# Patient Record
Sex: Female | Born: 1960
Health system: Southern US, Community
[De-identification: ages and names within clinical notes are randomized; demographics above are authoritative.]

## PROBLEM LIST (undated history)

## (undated) DIAGNOSIS — D259 Leiomyoma of uterus, unspecified: Secondary | ICD-10-CM

## (undated) DIAGNOSIS — Z86711 Personal history of pulmonary embolism: Secondary | ICD-10-CM

## (undated) DIAGNOSIS — K219 Gastro-esophageal reflux disease without esophagitis: Secondary | ICD-10-CM

## (undated) DIAGNOSIS — Z87442 Personal history of urinary calculi: Secondary | ICD-10-CM

## (undated) DIAGNOSIS — K449 Diaphragmatic hernia without obstruction or gangrene: Secondary | ICD-10-CM

## (undated) DIAGNOSIS — R319 Hematuria, unspecified: Secondary | ICD-10-CM

## (undated) DIAGNOSIS — K5900 Constipation, unspecified: Secondary | ICD-10-CM

## (undated) DIAGNOSIS — R350 Frequency of micturition: Secondary | ICD-10-CM

## (undated) DIAGNOSIS — N201 Calculus of ureter: Secondary | ICD-10-CM

## (undated) DIAGNOSIS — M7660 Achilles tendinitis, unspecified leg: Secondary | ICD-10-CM

## (undated) DIAGNOSIS — Z8759 Personal history of other complications of pregnancy, childbirth and the puerperium: Secondary | ICD-10-CM

## (undated) DIAGNOSIS — Z973 Presence of spectacles and contact lenses: Secondary | ICD-10-CM

## (undated) DIAGNOSIS — Z9109 Other allergy status, other than to drugs and biological substances: Secondary | ICD-10-CM

## (undated) DIAGNOSIS — R3915 Urgency of urination: Secondary | ICD-10-CM

## (undated) DIAGNOSIS — M1990 Unspecified osteoarthritis, unspecified site: Secondary | ICD-10-CM

## (undated) HISTORY — PX: STRABISMUS SURGERY: SHX218

## (undated) HISTORY — PX: WISDOM TOOTH EXTRACTION: SHX21

## (undated) HISTORY — PX: TONSILLECTOMY: SUR1361

## (undated) HISTORY — DX: Other allergy status, other than to drugs and biological substances: Z91.09

---

## 1993-07-24 HISTORY — PX: LAPAROSCOPIC CHOLECYSTECTOMY: SUR755

## 2001-10-16 ENCOUNTER — Other Ambulatory Visit: Admission: RE | Admit: 2001-10-16 | Discharge: 2001-10-16 | Payer: Self-pay | Admitting: Obstetrics and Gynecology

## 2002-02-27 ENCOUNTER — Encounter: Payer: Self-pay | Admitting: Obstetrics and Gynecology

## 2002-02-27 ENCOUNTER — Ambulatory Visit (HOSPITAL_COMMUNITY): Admission: RE | Admit: 2002-02-27 | Discharge: 2002-02-27 | Payer: Self-pay | Admitting: Obstetrics and Gynecology

## 2002-10-21 ENCOUNTER — Other Ambulatory Visit: Admission: RE | Admit: 2002-10-21 | Discharge: 2002-10-21 | Payer: Self-pay | Admitting: Obstetrics and Gynecology

## 2003-03-02 ENCOUNTER — Ambulatory Visit (HOSPITAL_COMMUNITY): Admission: RE | Admit: 2003-03-02 | Discharge: 2003-03-02 | Payer: Self-pay | Admitting: Pediatrics

## 2003-03-02 ENCOUNTER — Encounter: Payer: Self-pay | Admitting: Obstetrics and Gynecology

## 2003-11-10 ENCOUNTER — Other Ambulatory Visit: Admission: RE | Admit: 2003-11-10 | Discharge: 2003-11-10 | Payer: Self-pay | Admitting: Obstetrics and Gynecology

## 2004-03-03 ENCOUNTER — Ambulatory Visit (HOSPITAL_COMMUNITY): Admission: RE | Admit: 2004-03-03 | Discharge: 2004-03-03 | Payer: Self-pay | Admitting: Obstetrics and Gynecology

## 2004-11-11 ENCOUNTER — Other Ambulatory Visit: Admission: RE | Admit: 2004-11-11 | Discharge: 2004-11-11 | Payer: Self-pay | Admitting: Obstetrics and Gynecology

## 2005-03-06 ENCOUNTER — Ambulatory Visit (HOSPITAL_COMMUNITY): Admission: RE | Admit: 2005-03-06 | Discharge: 2005-03-06 | Payer: Self-pay | Admitting: Obstetrics and Gynecology

## 2005-11-15 ENCOUNTER — Other Ambulatory Visit: Admission: RE | Admit: 2005-11-15 | Discharge: 2005-11-15 | Payer: Self-pay | Admitting: Obstetrics and Gynecology

## 2006-03-08 ENCOUNTER — Ambulatory Visit (HOSPITAL_COMMUNITY): Admission: RE | Admit: 2006-03-08 | Discharge: 2006-03-08 | Payer: Self-pay | Admitting: Obstetrics & Gynecology

## 2006-03-15 ENCOUNTER — Encounter: Admission: RE | Admit: 2006-03-15 | Discharge: 2006-03-15 | Payer: Self-pay | Admitting: Obstetrics & Gynecology

## 2006-12-07 ENCOUNTER — Other Ambulatory Visit: Admission: RE | Admit: 2006-12-07 | Discharge: 2006-12-07 | Payer: Self-pay | Admitting: Obstetrics & Gynecology

## 2007-03-18 ENCOUNTER — Ambulatory Visit (HOSPITAL_COMMUNITY): Admission: RE | Admit: 2007-03-18 | Discharge: 2007-03-18 | Payer: Self-pay | Admitting: Obstetrics & Gynecology

## 2007-07-01 ENCOUNTER — Emergency Department (HOSPITAL_COMMUNITY): Admission: EM | Admit: 2007-07-01 | Discharge: 2007-07-01 | Payer: Self-pay | Admitting: Emergency Medicine

## 2007-12-13 ENCOUNTER — Other Ambulatory Visit: Admission: RE | Admit: 2007-12-13 | Discharge: 2007-12-13 | Payer: Self-pay | Admitting: Obstetrics & Gynecology

## 2008-03-19 ENCOUNTER — Ambulatory Visit (HOSPITAL_COMMUNITY): Admission: RE | Admit: 2008-03-19 | Discharge: 2008-03-19 | Payer: Self-pay | Admitting: Obstetrics & Gynecology

## 2009-03-22 ENCOUNTER — Ambulatory Visit (HOSPITAL_COMMUNITY): Admission: RE | Admit: 2009-03-22 | Discharge: 2009-03-22 | Payer: Self-pay | Admitting: Obstetrics & Gynecology

## 2010-03-23 ENCOUNTER — Ambulatory Visit (HOSPITAL_COMMUNITY): Admission: RE | Admit: 2010-03-23 | Discharge: 2010-03-23 | Payer: Self-pay | Admitting: Obstetrics & Gynecology

## 2010-08-07 ENCOUNTER — Emergency Department (HOSPITAL_BASED_OUTPATIENT_CLINIC_OR_DEPARTMENT_OTHER)
Admission: EM | Admit: 2010-08-07 | Discharge: 2010-08-07 | Payer: Self-pay | Source: Home / Self Care | Admitting: Emergency Medicine

## 2010-08-08 LAB — URINE MICROSCOPIC-ADD ON

## 2010-08-08 LAB — URINALYSIS, ROUTINE W REFLEX MICROSCOPIC
Bilirubin Urine: NEGATIVE
Ketones, ur: NEGATIVE mg/dL
Leukocytes, UA: NEGATIVE
Nitrite: NEGATIVE
Protein, ur: NEGATIVE mg/dL
Specific Gravity, Urine: 1.005 (ref 1.005–1.030)
Urine Glucose, Fasting: NEGATIVE mg/dL
Urobilinogen, UA: 0.2 mg/dL (ref 0.0–1.0)
pH: 6 (ref 5.0–8.0)

## 2010-08-08 LAB — WET PREP, GENITAL
Trich, Wet Prep: NONE SEEN
Yeast Wet Prep HPF POC: NONE SEEN

## 2010-08-10 LAB — GC/CHLAMYDIA PROBE AMP, GENITAL
Chlamydia, DNA Probe: NEGATIVE
GC Probe Amp, Genital: NEGATIVE

## 2010-08-10 LAB — URINE CULTURE
Colony Count: NO GROWTH
Culture  Setup Time: 201201152204
Culture: NO GROWTH

## 2010-08-14 ENCOUNTER — Encounter: Payer: Self-pay | Admitting: Obstetrics & Gynecology

## 2010-08-19 ENCOUNTER — Encounter
Admission: RE | Admit: 2010-08-19 | Discharge: 2010-08-19 | Payer: Self-pay | Source: Home / Self Care | Attending: Family Medicine | Admitting: Family Medicine

## 2010-12-06 NOTE — Consult Note (Signed)
Sharon Page, Sharon Page             ACCOUNT NO.:  [id number]   MEDICAL RECORD NO.:  [id number]          PATIENT TYPE:  EMS   LOCATION:  [facility]                         FACILITY:  [facility]   PHYSICIAN:  Wilson Singer, M.D.DATE OF BIRTH:  [birth year]   DATE OF CONSULTATION:  07/01/2007  DATE OF DISCHARGE:                                 CONSULTATION   REFERRING PHYSICIAN:  Dr. Valma Cava.   HISTORY:  This is a 50 year old somewhat overweight lady who presents  with sharp lower chest pain which she has had about 3 episodes today,  each lasting only 10 minutes and associated with possible left shoulder  pain.  She says that a couple of days ago she was lifting heavy boxes  doing Christmas decorations.  The pain is described as sharp in nature  and is not associated with nausea, sweating, or shortness of breath.  She has no family history of early coronary artery disease.  She is not  diabetic, hypertensive, nor does she have any history of hyperlipidemia.  She is a nonsmoker.   PAST SURGICAL HISTORY:  1. Two cesarean sections.  2. Cholecystectomy.  3. Tonsillectomy.  4. Eye surgery.   SOCIAL HISTORY:  She has been married for 22 years.  She does not smoke  and does not drink alcohol.  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   MEDICATIONS:  Birth control pill.   ALLERGIES:  MORPHINE.   REVIEW OF SYSTEMS:  Apart from the symptoms mentioned above, there are  no other symptoms referable to all systems reviewed.   PHYSICAL EXAMINATION:  VITAL SIGNS:  Temperature 98.6, blood pressure  124/79, pulse 71, saturation 100%, respiratory rate 12.  CARDIOVASCULAR:  Heart sounds are present and normal.  There are no  murmurs.  There is no pericardial rub.  RESPIRATORY:  Lung fields are clear.  There is no pleural rub.  There  are no wheezes or crackles.  ABDOMEN:  Soft, nontender with no hepatosplenomegaly.  NEUROLOGICAL:  Alert and oriented with no focal neurologic signs.  ANTERIOR CHEST WALL:  She clearly is tender in the lower sternum,  reproducing her pain.  She also is tender in the left upper arm,  reproducing the pain that she described as being in the left shoulder  area.   INVESTIGATIONS:  Electrocardiogram done in the emergency room shows  normal sinus rhythm and is essentially within normal limits with no  acute ST-T wave changes.  Sodium 138, potassium 3.9, chloride 106, BUN  8, glucose 90, creatinine 1.0.  Hemoglobin 14.1, white blood cell count  6.0, platelets 214,000.  Troponin less than 0.05.  D-dimer 0.32.  Chest  x-ray done looks within normal limits to my eye.   IMPRESSION:  Musculoskeletal chest pain.   I do not think this is cardiac pain, and she has no major risk factors  for cardiac disease.   PLAN:  I am going to send her home, and I have given her a prescription  for ibuprofen 800 mg t.i.d. with food for the next 5 days.  She must  follow up with her primary  care physician in the next day or two to see  if he recommends an outpatient stress test under the care of a  cardiologist, but I would think that she clearly has low risk for  cardiac disease.      Wilson Singer, M.D.  Electronically Signed     NCG/MEDQ  D:  07/01/2007  T:  07/02/2007  Job:  [id number]   cc:   Theron Arista A. Patrica Duel, M.D.  [facility]

## 2011-01-16 ENCOUNTER — Other Ambulatory Visit: Payer: Self-pay | Admitting: Family Medicine

## 2011-01-16 DIAGNOSIS — R52 Pain, unspecified: Secondary | ICD-10-CM

## 2011-01-16 DIAGNOSIS — R609 Edema, unspecified: Secondary | ICD-10-CM

## 2011-01-17 ENCOUNTER — Ambulatory Visit
Admission: RE | Admit: 2011-01-17 | Discharge: 2011-01-17 | Disposition: A | Discharge status: Home / Self Care | Payer: BC Managed Care – PPO | Source: Ambulatory Visit | Attending: Family Medicine | Admitting: Family Medicine

## 2011-01-17 DIAGNOSIS — R609 Edema, unspecified: Secondary | ICD-10-CM

## 2011-01-17 DIAGNOSIS — R52 Pain, unspecified: Secondary | ICD-10-CM

## 2011-01-21 DIAGNOSIS — Z86711 Personal history of pulmonary embolism: Secondary | ICD-10-CM | POA: Insufficient documentation

## 2011-01-21 HISTORY — DX: Personal history of pulmonary embolism: Z86.711

## 2011-01-22 ENCOUNTER — Emergency Department (HOSPITAL_BASED_OUTPATIENT_CLINIC_OR_DEPARTMENT_OTHER)
Admission: EM | Admit: 2011-01-22 | Discharge: 2011-01-22 | Disposition: A | Discharge status: Other Acute Inpatient Hospital | Payer: BC Managed Care – PPO | Attending: Emergency Medicine | Admitting: Emergency Medicine

## 2011-01-22 ENCOUNTER — Emergency Department (INDEPENDENT_AMBULATORY_CARE_PROVIDER_SITE_OTHER): Payer: BC Managed Care – PPO

## 2011-01-22 DIAGNOSIS — R0602 Shortness of breath: Secondary | ICD-10-CM

## 2011-01-22 DIAGNOSIS — I2699 Other pulmonary embolism without acute cor pulmonale: Secondary | ICD-10-CM

## 2011-01-22 DIAGNOSIS — R079 Chest pain, unspecified: Secondary | ICD-10-CM

## 2011-01-22 LAB — DIFFERENTIAL
Basophils Absolute: 0 10*3/uL (ref 0.0–0.1)
Basophils Relative: 0 % (ref 0–1)
Eosinophils Absolute: 0.3 10*3/uL (ref 0.0–0.7)
Eosinophils Relative: 3 % (ref 0–5)
Lymphocytes Relative: 23 % (ref 12–46)
Lymphs Abs: 2 10*3/uL (ref 0.7–4.0)
Monocytes Absolute: 0.6 10*3/uL (ref 0.1–1.0)
Monocytes Relative: 7 % (ref 3–12)
Neutro Abs: 6 10*3/uL (ref 1.7–7.7)
Neutrophils Relative %: 67 % (ref 43–77)

## 2011-01-22 LAB — CBC
HCT: 39.4 % (ref 36.0–46.0)
Hemoglobin: 13.5 g/dL (ref 12.0–15.0)
MCH: 29.3 pg (ref 26.0–34.0)
MCHC: 34.3 g/dL (ref 30.0–36.0)
MCV: 85.5 fL (ref 78.0–100.0)
Platelets: 174 10*3/uL (ref 150–400)
RBC: 4.61 MIL/uL (ref 3.87–5.11)
RDW: 13 % (ref 11.5–15.5)
WBC: 8.9 10*3/uL (ref 4.0–10.5)

## 2011-01-22 LAB — COMPREHENSIVE METABOLIC PANEL
ALT: 11 U/L (ref 0–35)
Albumin: 3.6 g/dL (ref 3.5–5.2)
Calcium: 9.1 mg/dL (ref 8.4–10.5)
GFR calc Af Amer: >60 mL/min (ref >60)
Glucose, Bld: 109 mg/dL — ABNORMAL HIGH (ref 70–99)
Sodium: 140 mEq/L (ref 135–145)
Total Protein: 7.1 g/dL (ref 6.0–8.3)

## 2011-01-22 LAB — D-DIMER, QUANTITATIVE: D-Dimer, Quant: 1.82 ug/mL-FEU — ABNORMAL HIGH (ref 0.00–0.48)

## 2011-01-22 LAB — CK TOTAL AND CKMB (NOT AT ARMC)
CK, MB: 1 ng/mL (ref 0.3–4.0)
Relative Index: INVALID (ref 0.0–2.5)
Total CK: 57 U/L (ref 7–177)

## 2011-01-22 LAB — TROPONIN I: Troponin I: <0.3 ng/mL (ref <0.30)

## 2011-01-22 LAB — APTT: aPTT: 25 seconds (ref 24–37)

## 2011-01-22 LAB — PROTIME-INR: INR: 0.88 (ref 0.00–1.49)

## 2011-01-22 MED ORDER — IOHEXOL 350 MG/ML SOLN
80.0000 mL | Freq: Once | INTRAVENOUS | Status: AC | PRN
  Administered 2011-01-22: 80 mL via INTRAVENOUS

## 2011-02-27 ENCOUNTER — Other Ambulatory Visit: Payer: Self-pay | Admitting: Obstetrics & Gynecology

## 2011-02-27 DIAGNOSIS — Z1231 Encounter for screening mammogram for malignant neoplasm of breast: Secondary | ICD-10-CM

## 2011-03-28 ENCOUNTER — Ambulatory Visit (HOSPITAL_COMMUNITY)
Admission: RE | Admit: 2011-03-28 | Discharge: 2011-03-28 | Disposition: A | Discharge status: Home / Self Care | Payer: BC Managed Care – PPO | Source: Ambulatory Visit | Attending: Obstetrics & Gynecology | Admitting: Obstetrics & Gynecology

## 2011-03-28 DIAGNOSIS — Z1231 Encounter for screening mammogram for malignant neoplasm of breast: Secondary | ICD-10-CM | POA: Insufficient documentation

## 2011-05-01 LAB — I-STAT 8, (EC8 V) (CONVERTED LAB)
Acid-base deficit: 1
Bicarbonate: 24.4 — ABNORMAL HIGH
HCT: 45
Hemoglobin: 15.3 — ABNORMAL HIGH
Operator id: 294501
Sodium: 138
TCO2: 26

## 2011-05-01 LAB — DIFFERENTIAL
Basophils Absolute: 0
Lymphocytes Relative: 33
Neutro Abs: 3.6

## 2011-05-01 LAB — POCT CARDIAC MARKERS
Myoglobin, poc: 102
Operator id: 294501
Troponin i, poc: <0.05

## 2011-05-01 LAB — CBC
Hemoglobin: 14.1
Platelets: 214
RDW: 12.8
WBC: 6

## 2011-05-01 LAB — POCT I-STAT CREATININE: Operator id: 294501

## 2011-06-28 ENCOUNTER — Emergency Department (HOSPITAL_BASED_OUTPATIENT_CLINIC_OR_DEPARTMENT_OTHER)
Admission: EM | Admit: 2011-06-28 | Discharge: 2011-06-28 | Disposition: A | Discharge status: Home / Self Care | Payer: BC Managed Care – PPO | Attending: Emergency Medicine | Admitting: Emergency Medicine

## 2011-06-28 ENCOUNTER — Encounter: Payer: Self-pay | Admitting: Family Medicine

## 2011-06-28 ENCOUNTER — Other Ambulatory Visit: Payer: Self-pay

## 2011-06-28 DIAGNOSIS — Z7901 Long term (current) use of anticoagulants: Secondary | ICD-10-CM | POA: Insufficient documentation

## 2011-06-28 DIAGNOSIS — R42 Dizziness and giddiness: Secondary | ICD-10-CM | POA: Insufficient documentation

## 2011-06-28 DIAGNOSIS — R5381 Other malaise: Secondary | ICD-10-CM | POA: Insufficient documentation

## 2011-06-28 DIAGNOSIS — R5383 Other fatigue: Secondary | ICD-10-CM | POA: Insufficient documentation

## 2011-06-28 DIAGNOSIS — R531 Weakness: Secondary | ICD-10-CM

## 2011-06-28 DIAGNOSIS — Z79899 Other long term (current) drug therapy: Secondary | ICD-10-CM | POA: Insufficient documentation

## 2011-06-28 DIAGNOSIS — N390 Urinary tract infection, site not specified: Secondary | ICD-10-CM

## 2011-06-28 LAB — URINALYSIS, ROUTINE W REFLEX MICROSCOPIC
Bilirubin Urine: NEGATIVE
Glucose, UA: NEGATIVE mg/dL
Hgb urine dipstick: NEGATIVE
Specific Gravity, Urine: 1.025 (ref 1.005–1.030)
Urobilinogen, UA: 0.2 mg/dL (ref 0.0–1.0)
pH: 6 (ref 5.0–8.0)

## 2011-06-28 LAB — CBC
MCV: 86.1 fL (ref 78.0–100.0)
Platelets: 206 10*3/uL (ref 150–400)
RDW: 12.8 % (ref 11.5–15.5)
WBC: 6.1 10*3/uL (ref 4.0–10.5)

## 2011-06-28 LAB — COMPREHENSIVE METABOLIC PANEL
ALT: 18 U/L (ref 0–35)
AST: 16 U/L (ref 0–37)
Albumin: 4.2 g/dL (ref 3.5–5.2)
CO2: 26 mEq/L (ref 19–32)
Calcium: 9.3 mg/dL (ref 8.4–10.5)
GFR calc non Af Amer: 85 mL/min — ABNORMAL LOW (ref >90)
Sodium: 140 mEq/L (ref 135–145)

## 2011-06-28 LAB — DIFFERENTIAL
Basophils Absolute: 0 10*3/uL (ref 0.0–0.1)
Eosinophils Relative: 3 % (ref 0–5)
Lymphocytes Relative: 50 % — ABNORMAL HIGH (ref 12–46)
Neutro Abs: 2.5 10*3/uL (ref 1.7–7.7)

## 2011-06-28 LAB — PROTIME-INR
INR: 1.31 (ref 0.00–1.49)
Prothrombin Time: 16.5 seconds — ABNORMAL HIGH (ref 11.6–15.2)

## 2011-06-28 LAB — URINE MICROSCOPIC-ADD ON

## 2011-06-28 MED ORDER — SULFAMETHOXAZOLE-TRIMETHOPRIM 800-160 MG PO TABS: 1.0000 | ORAL_TABLET | Freq: Two times a day (BID) | ORAL | Status: AC

## 2011-06-28 NOTE — ED Notes (Addendum)
Pt c/o dizziness x 2 days. Pt sts she had a nosebleed on Sunday. Pt is on coumadin and level recently checked. Pt denies cp, shob. Pt reports long flight last week.

## 2011-06-28 NOTE — ED Notes (Signed)
MD in to re-evaluate pt now.

## 2011-06-28 NOTE — ED Provider Notes (Addendum)
History     CSN: [id number] Arrival date & time: 06/28/2011  7:47 PM   First MD Initiated Contact with Patient 06/28/11 2025      Chief Complaint  Patient presents with  . Dizziness   very pleasant female with a known history of pulmonary embolism. It was diagnosed this past summer. She is on Coumadin. SHe states she began having a head cold approximately a week ago and was seen by her primary doctor, who put her on a Z-Pak. She was also told that she had some fluid behind her ears. Patient then had epistaxis later that week. This last Sunday he went to an urgent care to have her INR checked which was reported as being slightly subtherapeutic. She then felt better. The next day however, over the past 2 days. She has felt somewhat run down, somewhat weak. However, she has had no chest pain or any difficulty breathing. She denies any dysuria or hematuria. She has had no headache no nausea, no visual changes. Essentially, does feel somewhat out of sorts. She does admit to being under a lot of stress recently.  (Consider location/radiation/quality/duration/timing/severity/associated sxs/prior treatment) HPI  Past Medical History  Diagnosis Date  . Pulmonary embolism     Past Surgical History  Procedure Date  . Cholecystectomy   . Tonsillectomy   . Cesarean section     No family history on file.  History  Substance Use Topics  . Smoking status: Never Smoker   . Smokeless tobacco: Not on file  . Alcohol Use: Yes    OB History    Grav Para Term Preterm Abortions TAB SAB Ect Mult Living                  Review of Systems  All other systems reviewed and are negative.    Allergies  Ciprofloxacin hcl; Dilaudid; and Morphine and related  Home Medications   Current Outpatient Rx  Name Route Sig Dispense Refill  . ONE-DAILY MULTI VITAMINS PO TABS Oral Take 1 tablet by mouth daily.      Marland Kitchen VITAMIN C 500 MG PO TABS Oral Take 500 mg by mouth daily.      Marland Kitchen VITAMIN D  (ERGOCALCIFEROL) 50000 UNITS PO CAPS Oral Take 50,000 Units by mouth every 14 (fourteen) days. Take on Sunday     . WARFARIN SODIUM 2.5 MG PO TABS Oral Take 2.5-5 mg by mouth daily. Take 1 tab on Monday, Wednesday, Friday and Saturday. Take 2 tabs on Tuesday, Thursday and Sunday      BP 152/103  Pulse 58  Temp(Src) 98 F (36.7 C) (Oral)  Resp 16  Ht 5\' 1"  (1.549 m)  Wt 206 lb (93.441 kg)  BMI 38.92 kg/m2  SpO2 100%  LMP 12/28/2010  Physical Exam  Nursing note and vitals reviewed. Constitutional: She is oriented to person, place, and time. She appears well-developed and well-nourished.  HENT:  Head: Normocephalic and atraumatic.  Eyes: Conjunctivae and EOM are normal. Pupils are equal, round, and reactive to light.  Neck: Neck supple.  Cardiovascular: Normal rate and regular rhythm.  Exam reveals no gallop and no friction rub.   No murmur heard. Pulmonary/Chest: Breath sounds normal. She has no wheezes. She has no rales. She exhibits no tenderness.  Abdominal: Soft. Bowel sounds are normal. She exhibits no distension. There is no tenderness. There is no rebound and no guarding.  Musculoskeletal: Normal range of motion. She exhibits no edema and no tenderness.  Neurological: She is alert  and oriented to person, place, and time. No cranial nerve deficit. Coordination normal.  Skin: Skin is warm and dry. No rash noted.  Psychiatric: She has a normal mood and affect.    ED Course  Procedures (including critical care time)  Labs Reviewed  DIFFERENTIAL - Abnormal; Notable for the following:    Neutrophils Relative 42 (*)    Lymphocytes Relative 50 (*)    All other components within normal limits  COMPREHENSIVE METABOLIC PANEL - Abnormal; Notable for the following:    Total Bilirubin 0.2 (*)    GFR calc non Af Amer 85 (*)    All other components within normal limits  URINALYSIS, ROUTINE W REFLEX MICROSCOPIC - Abnormal; Notable for the following:    Leukocytes, UA MODERATE (*)     All other components within normal limits  URINE MICROSCOPIC-ADD ON - Abnormal; Notable for the following:    Crystals CA OXALATE CRYSTALS (*)    All other components within normal limits  PROTIME-INR - Abnormal; Notable for the following:    Prothrombin Time 16.5 (*)    All other components within normal limits  CBC   No results found.   No diagnosis found.    MDM  Pt is seen and examined;  Initial history and physical completed.  Will follow.    Date: 06/28/2011  Rate: 63  Rhythm: normal sinus rhythm  QRS Axis: normal  Intervals: normal  ST/T Wave abnormalities: normal  Conduction Disutrbances:none  Narrative Interpretation:   Old EKG Reviewed: none available            Michelle Vanhise A. Patrica Duel, MD 06/28/11 2040  Results for orders placed during the hospital encounter of 06/28/11  CBC      Component Value Range   WBC 6.1  4.0 - 10.5 (K/uL)   RBC 4.98  3.87 - 5.11 (MIL/uL)   Hemoglobin 14.6  12.0 - 15.0 (g/dL)   HCT 16.1  09.6 - 04.5 (%)   MCV 86.1  78.0 - 100.0 (fL)   MCH 29.3  26.0 - 34.0 (pg)   MCHC 34.0  30.0 - 36.0 (g/dL)   RDW 40.9  81.1 - 91.4 (%)   Platelets 206  150 - 400 (K/uL)  DIFFERENTIAL      Component Value Range   Neutrophils Relative 42 (*) 43 - 77 (%)   Neutro Abs 2.5  1.7 - 7.7 (K/uL)   Lymphocytes Relative 50 (*) 12 - 46 (%)   Lymphs Abs 3.0  0.7 - 4.0 (K/uL)   Monocytes Relative 5  3 - 12 (%)   Monocytes Absolute 0.3  0.1 - 1.0 (K/uL)   Eosinophils Relative 3  0 - 5 (%)   Eosinophils Absolute 0.2  0.0 - 0.7 (K/uL)   Basophils Relative 1  0 - 1 (%)   Basophils Absolute 0.0  0.0 - 0.1 (K/uL)  COMPREHENSIVE METABOLIC PANEL      Component Value Range   Sodium 140  135 - 145 (mEq/L)   Potassium 3.5  3.5 - 5.1 (mEq/L)   Chloride 105  96 - 112 (mEq/L)   CO2 26  19 - 32 (mEq/L)   Glucose, Bld 88  70 - 99 (mg/dL)   BUN 16  6 - 23 (mg/dL)   Creatinine, Ser 7.82  0.50 - 1.10 (mg/dL)   Calcium 9.3  8.4 - 95.6 (mg/dL)   Total Protein 7.3   6.0 - 8.3 (g/dL)   Albumin 4.2  3.5 - 5.2 (g/dL)   AST  16  0 - 37 (U/L)   ALT 18  0 - 35 (U/L)   Alkaline Phosphatase 86  39 - 117 (U/L)   Total Bilirubin 0.2 (*) 0.3 - 1.2 (mg/dL)   GFR calc non Af Amer 85 (*) >90 (mL/min)   GFR calc Af Amer >90  >90 (mL/min)  URINALYSIS, ROUTINE W REFLEX MICROSCOPIC      Component Value Range   Color, Urine YELLOW  YELLOW    APPearance CLEAR  CLEAR    Specific Gravity, Urine 1.025  1.005 - 1.030    pH 6.0  5.0 - 8.0    Glucose, UA NEGATIVE  NEGATIVE (mg/dL)   Hgb urine dipstick NEGATIVE  NEGATIVE    Bilirubin Urine NEGATIVE  NEGATIVE    Ketones, ur NEGATIVE  NEGATIVE (mg/dL)   Protein, ur NEGATIVE  NEGATIVE (mg/dL)   Urobilinogen, UA 0.2  0.0 - 1.0 (mg/dL)   Nitrite NEGATIVE  NEGATIVE    Leukocytes, UA MODERATE (*) NEGATIVE   URINE MICROSCOPIC-ADD ON      Component Value Range   Squamous Epithelial / LPF RARE  RARE    WBC, UA 7-10  <3 (WBC/hpf)   Bacteria, UA RARE  RARE    Crystals CA OXALATE CRYSTALS (*) NEGATIVE   PROTIME-INR      Component Value Range   Prothrombin Time 16.5 (*) 11.6 - 15.2 (seconds)   INR 1.31  0.00 - 1.49    No results found.    Florie Carico A. Patrica Duel, MD 06/28/11 2112

## 2011-09-21 ENCOUNTER — Encounter (HOSPITAL_BASED_OUTPATIENT_CLINIC_OR_DEPARTMENT_OTHER): Payer: Self-pay

## 2011-09-21 ENCOUNTER — Emergency Department (HOSPITAL_BASED_OUTPATIENT_CLINIC_OR_DEPARTMENT_OTHER)
Admission: EM | Admit: 2011-09-21 | Discharge: 2011-09-21 | Disposition: A | Discharge status: Home / Self Care | Payer: BC Managed Care – PPO | Attending: Emergency Medicine | Admitting: Emergency Medicine

## 2011-09-21 DIAGNOSIS — Z86718 Personal history of other venous thrombosis and embolism: Secondary | ICD-10-CM | POA: Insufficient documentation

## 2011-09-21 DIAGNOSIS — Z7901 Long term (current) use of anticoagulants: Secondary | ICD-10-CM | POA: Insufficient documentation

## 2011-09-21 DIAGNOSIS — M79609 Pain in unspecified limb: Secondary | ICD-10-CM | POA: Insufficient documentation

## 2011-09-21 DIAGNOSIS — M79606 Pain in leg, unspecified: Secondary | ICD-10-CM

## 2011-09-21 DIAGNOSIS — Z79899 Other long term (current) drug therapy: Secondary | ICD-10-CM | POA: Insufficient documentation

## 2011-09-21 NOTE — ED Notes (Signed)
Pt c/o L thigh pain onset yesterday.  Pt describes as constant dull pain and intermittent sharp pain.

## 2011-09-21 NOTE — ED Provider Notes (Signed)
History     CSN: [id number]  Arrival date & time 09/21/11  1716   First MD Initiated Contact with Patient 09/21/11 1731      Chief Complaint  Patient presents with  . Leg Pain    (Consider location/radiation/quality/duration/timing/severity/associated sxs/prior treatment) HPI Comments: Patient presents complaining of left medial thigh pain since yesterday.  It is intermittent in nature.  It was somewhat dull in nature initially and does have some intermittent sharp sensation.  Patient does not want any further pain medication for it is time.  She did contact her primary care physician who instructed her to come to ER because of concern for possible DVT.  Patient did have a history of PEs after long car trips in [place] and back while she was on birth control.  Patient is currently on Coumadin and notes she had an INR 3.1 on Tuesday.  She is not on birth control anymore.  She has no chest pain or shortness of breath at this time.  She does not believe that her leg is swollen at this time.  Patient is a 51 y.o. female presenting with leg pain. The history is provided by the patient. No language interpreter was used.  Leg Pain  The incident occurred yesterday. There was no injury mechanism. The quality of the pain is described as aching and sharp. The pain is mild. The pain has been intermittent since onset. Pertinent negatives include no numbness, no inability to bear weight, no loss of motion, no muscle weakness, no loss of sensation and no tingling.    Past Medical History  Diagnosis Date  . Pulmonary embolism     Past Surgical History  Procedure Date  . Cholecystectomy   . Tonsillectomy   . Cesarean section   . Eye surgery     History reviewed. No pertinent family history.  History  Substance Use Topics  . Smoking status: Never Smoker   . Smokeless tobacco: Not on file  . Alcohol Use: Yes    OB History    Grav Para Term Preterm Abortions TAB SAB Ect Mult Living              Review of Systems  Constitutional: Negative.  Negative for fever and chills.  HENT: Negative.   Eyes: Negative.  Negative for discharge and redness.  Respiratory: Negative.  Negative for cough and shortness of breath.   Cardiovascular: Negative.  Negative for chest pain.  Gastrointestinal: Negative.  Negative for nausea, vomiting, abdominal pain and diarrhea.  Genitourinary: Negative.  Negative for dysuria and vaginal discharge.  Musculoskeletal: Negative for back pain.  Skin: Negative.  Negative for color change and rash.  Neurological: Negative.  Negative for tingling, syncope, numbness and headaches.  Hematological: Negative.  Negative for adenopathy.  Psychiatric/Behavioral: Negative.  Negative for confusion.  All other systems reviewed and are negative.    Allergies  Ciprofloxacin hcl; Dilaudid; and Morphine and related  Home Medications   Current Outpatient Rx  Name Route Sig Dispense Refill  . AZITHROMYCIN 250 MG PO TABS Oral Take 250 mg by mouth daily.    Marland Kitchen ESCITALOPRAM OXALATE 5 MG PO TABS Oral Take 5 mg by mouth daily.    Marland Kitchen ONE-DAILY MULTI VITAMINS PO TABS Oral Take 1 tablet by mouth daily.      Marland Kitchen VITAMIN C 500 MG PO TABS Oral Take 500 mg by mouth daily.      Marland Kitchen VITAMIN D (ERGOCALCIFEROL) 50000 UNITS PO CAPS Oral Take 50,000 Units by  mouth every 14 (fourteen) days. Take on Sunday     . WARFARIN SODIUM 2.5 MG PO TABS Oral Take 2.5-5 mg by mouth daily. Take 1 tab on Monday, Wednesday, Friday and Saturday. Take 2 tabs on Tuesday, Thursday and Sunday      BP 135/70  Pulse 66  Temp(Src) 97.5 F (36.4 C) (Oral)  Resp 16  Ht 5\' 1"  (1.549 m)  Wt 206 lb (93.441 kg)  BMI 38.92 kg/m2  SpO2 98%  LMP 12/28/2010  Physical Exam  Nursing note and vitals reviewed. Constitutional: She is oriented to person, place, and time. She appears well-developed and well-nourished.  Non-toxic appearance. She does not have a sickly appearance.  HENT:  Head: Normocephalic and  atraumatic.  Eyes: Conjunctivae, EOM and lids are normal. Pupils are equal, round, and reactive to light. No scleral icterus.  Neck: Trachea normal and normal range of motion. Neck supple.  Cardiovascular: Normal rate.   Pulmonary/Chest: Effort normal.  Abdominal: Normal appearance. There is no CVA tenderness.  Musculoskeletal: Normal range of motion. She exhibits no edema and no tenderness.       No erythema or induration to her left medial thigh.  No tenderness to palpation.  No palpable masses.  Palpable DP pulses in both feet.  Patient has good 5 out of 5 strength in both legs.  Good plantar and dorsiflexion of her foot.  Sensation light touch is intact.  Her legs appear symmetric and not swollen.  Neurological: She is alert and oriented to person, place, and time. She has normal strength.  Skin: Skin is warm, dry and intact. No rash noted.  Psychiatric: She has a normal mood and affect. Her behavior is normal. Judgment and thought content normal.    ED Course  Procedures (including critical care time)  Labs Reviewed - No data to display No results found.   No diagnosis found.    MDM  Patient clinically shows no signs of DVT at this point in time.  She has no signs of infection.  She has no neurovascular deficits that I can find on exam.  I've advised the patient to followup with her primary care physician if this continues or to return if she has other new symptoms or worsening of her pain.  She understands this at this time and is comfortable with the plan for discharge home.        Nat Christen, MD 09/21/11 [phone]

## 2011-09-21 NOTE — Discharge Instructions (Signed)
Pain of Unknown Etiology (Pain Without a Known Cause) You have come to your caregiver because of pain. Pain can occur in any part of the body. Often there is not a definite cause. If your laboratory (blood or urine) work was normal and x-rays or other studies were normal, your caregiver may treat you without knowing the cause of the pain. An example of this is the headache. Most headaches are diagnosed by taking a history. This means your caregiver asks you questions about your headaches. Your caregiver determines a treatment based on your answers. Usually testing done for headaches is normal. Often testing is not done unless there is no response to medications. Regardless of where your pain is located today, you can be given medications to make you comfortable. If no physical cause of pain can be found, most cases of pain will gradually leave as suddenly as they came.  If you have a painful condition and no reason can be found for the pain, It is importantthat you follow up with your caregiver. If the pain becomes worse or does not go away, it may be necessary to repeat tests and look further for a possible cause.  Only take over-the-counter or prescription medicines for pain, discomfort, or fever as directed by your caregiver.   For the protection of your privacy, test results can not be given over the phone. Make sure you receive the results of your test. Ask as to how these results are to be obtained if you have not been informed. It is your responsibility to obtain your test results.   You may continue all activities unless the activities cause more pain. When the pain lessens, it is important to gradually resume normal activities. Resume activities by beginning slowly and gradually increasing the intensity and duration of the activities or exercise. During periods of severe pain, bed-rest may be helpful. Lay or sit in any position that is comfortable.   Ice used for acute (sudden) conditions may be  effective. Use a large plastic bag filled with ice and wrapped in a towel. This may provide pain relief.   See your caregiver for continued problems. They can help or refer you for exercises or physical therapy if necessary.  If you were given medications for your condition, do not drive, operate machinery or power tools, or sign legal documents for 24 hours. Do not drink alcohol, take sleeping pills, or take other medications that may interfere with treatment. See your caregiver immediately if you have pain that is becoming worse and not relieved by medications. Document Released: 04/04/2001 Document Revised: 03/22/2011 Document Reviewed: 07/10/2005 Exit[employer] Patient Information 2012 [employer]. 

## 2012-03-01 ENCOUNTER — Other Ambulatory Visit: Payer: Self-pay | Admitting: Obstetrics & Gynecology

## 2012-03-01 DIAGNOSIS — Z1231 Encounter for screening mammogram for malignant neoplasm of breast: Secondary | ICD-10-CM

## 2012-03-28 ENCOUNTER — Ambulatory Visit
Admission: RE | Admit: 2012-03-28 | Discharge: 2012-03-28 | Disposition: A | Discharge status: Home / Self Care | Payer: BC Managed Care – PPO | Source: Ambulatory Visit | Attending: Obstetrics & Gynecology | Admitting: Obstetrics & Gynecology

## 2012-03-28 DIAGNOSIS — Z1231 Encounter for screening mammogram for malignant neoplasm of breast: Secondary | ICD-10-CM

## 2013-03-14 ENCOUNTER — Other Ambulatory Visit: Payer: Self-pay

## 2013-03-14 DIAGNOSIS — Z1231 Encounter for screening mammogram for malignant neoplasm of breast: Secondary | ICD-10-CM

## 2013-04-07 ENCOUNTER — Ambulatory Visit
Admission: RE | Admit: 2013-04-07 | Discharge: 2013-04-07 | Disposition: A | Discharge status: Home / Self Care | Payer: BC Managed Care – PPO | Source: Ambulatory Visit

## 2013-04-07 DIAGNOSIS — Z1231 Encounter for screening mammogram for malignant neoplasm of breast: Secondary | ICD-10-CM

## 2013-06-04 ENCOUNTER — Encounter: Payer: Self-pay | Admitting: Obstetrics & Gynecology

## 2013-06-05 ENCOUNTER — Encounter: Payer: Self-pay | Admitting: Obstetrics & Gynecology

## 2013-06-05 ENCOUNTER — Ambulatory Visit (INDEPENDENT_AMBULATORY_CARE_PROVIDER_SITE_OTHER): Payer: BC Managed Care – PPO | Admitting: Obstetrics & Gynecology

## 2013-06-05 VITALS — BP 124/80 | HR 60 | Resp 16 | Ht 61.0 in | Wt 224.4 lb

## 2013-06-05 DIAGNOSIS — Z0000 Encounter for general adult medical examination without abnormal findings: Secondary | ICD-10-CM

## 2013-06-05 DIAGNOSIS — Z01419 Encounter for gynecological examination (general) (routine) without abnormal findings: Secondary | ICD-10-CM

## 2013-06-05 DIAGNOSIS — Z1211 Encounter for screening for malignant neoplasm of colon: Secondary | ICD-10-CM

## 2013-06-05 DIAGNOSIS — L98499 Non-pressure chronic ulcer of skin of other sites with unspecified severity: Secondary | ICD-10-CM

## 2013-06-05 DIAGNOSIS — L98491 Non-pressure chronic ulcer of skin of other sites limited to breakdown of skin: Secondary | ICD-10-CM

## 2013-06-05 LAB — POCT URINALYSIS DIPSTICK
Bilirubin, UA: NEGATIVE
Glucose, UA: NEGATIVE
Nitrite, UA: NEGATIVE
Urobilinogen, UA: NEGATIVE
pH, UA: 7

## 2013-06-05 NOTE — Progress Notes (Addendum)
52 y.o. Sharon Page MarriedCaucasianF here for annual exam.  No vaginal bleeding. Painful intercourse.  Has tried lots of topical preparations.  H/O uterine fibroids.  Uterus last year on physical exam was much smaller.  8/13 uterus was 9.1 x 4.3 x 4.7cm.  Had blood work with Dr. Creta Levin.  Vit D was low.    Oldest daughter just finished master's disease.  Living in [place].  Twins are in college--one at [employer], other at [employer].  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  Plays bass clarinet.   Patient's last menstrual period was 12/28/2010.          Sexually active: yes  The current method of family planning is none.    Exercising: yes  cardio and strength Smoker:  no  Health Maintenance: Pap:  02/21/12 WNL/negative HR HPV History of abnormal Pap:  yes MMG:  04/07/13 3D normal, Grade 2 dense breasts Colonoscopy:  none BMD:   none TDaP:  5/08 Screening Labs: with Dr. Creta Levin this year, Hb today: 14.5, Urine today: WBC-2+, PH-7.0, PROTEIN-trace, RBC-trace   reports that she has never smoked. She has never used smokeless tobacco. She reports that she drinks about 2.0 ounces of alcohol per week. She reports that she does not use illicit drugs.  Past Medical History  Diagnosis Date  . Pulmonary embolism   . Environmental allergies   . Hx gestational diabetes   . Nephrolithiasis 2001    Past Surgical History  Procedure Laterality Date  . Cholecystectomy    . Tonsillectomy    . Cesarean section    . Eye surgery      Current Outpatient Prescriptions  Medication Sig Dispense Refill  . cholecalciferol (VITAMIN D) 1000 UNITS tablet Take 1,000 Units by mouth daily.      Marland Kitchen EVENING PRIMROSE OIL PO Take by mouth.      . fish oil-omega-3 fatty acids 1000 MG capsule Take 2 g by mouth daily.      . Ginkgo Biloba 40 MG TABS Take by mouth.      . Multiple Vitamin (MULTIVITAMIN) tablet Take 1 tablet by mouth daily.        . vitamin C (ASCORBIC ACID) 500 MG tablet Take 500 mg by  mouth daily.         No current facility-administered medications for this visit.    Family History  Problem Relation Age of Onset  . Diabetes Father   . Lung cancer Mother     lymphoma  . Skin cancer Maternal Grandmother   . Colon cancer Paternal Aunt     x2  . Hypertension Father   . Heart attack Brother     half brother  . Deep vein thrombosis Father   . Tuberculosis Mother     as a child    ROS:  Pertinent items are noted in HPI.  Otherwise, a comprehensive ROS was negative.  Exam:   BP 124/80  Pulse 60  Resp 16  Ht 5\' 1"  (1.549 m)  Wt 224 lb 6.4 oz (101.787 kg)  BMI 42.42 kg/m2  LMP 12/28/2010  Weight change: -3lbs  Height: 5\' 1"  (154.9 cm)  Ht Readings from Last 3 Encounters:  06/05/13 5\' 1"  (1.549 m)  09/21/11 5\' 1"  (1.549 m)  06/28/11 5\' 1"  (1.549 m)    General appearance: alert, cooperative and appears stated age Head: Normocephalic, without obvious abnormality, atraumatic Neck: no adenopathy, supple, symmetrical, trachea midline and thyroid normal to inspection and palpation Lungs:  clear to auscultation bilaterally Breasts: normal appearance, no masses or tenderness Heart: regular rate and rhythm Abdomen: soft, non-tender; bowel sounds normal; no masses,  no organomegaly Extremities: extremities normal, atraumatic, no cyanosis or edema Skin: Skin color, texture, turgor normal. Pt has ulcerated area on tip of nose. (pt reports won't heal) Lymph nodes: Cervical, supraclavicular, and axillary nodes normal. No abnormal inguinal nodes palpated Neurologic: Grossly normal   Pelvic: External genitalia:  no lesions              Urethra:  normal appearing urethra with no masses, tenderness or lesions              Bartholins and Skenes: normal                 Vagina: normal appearing vagina with normal color and discharge, no lesions              Cervix: no lesions              Pap taken: no Bimanual Exam:  Uterus:  enlarged, 8 weeks size              Adnexa:  normal adnexa and no mass, fullness, tenderness               Rectovaginal: Confirms               Anus:  normal sphincter tone, no lesions  A:  Well Woman with normal exam PMP, no HRT H/O uterine fibroids, much smaller last year on physical exam Nephrolithiasis H/O pulmonary emboli 7/12.  On coumadin x 1 year.  Off now.  Knows no HRT/estrogen products Dyspareunia the last two years Non-healing ulceration on tip of nose  P:   Mammogram yearly. pap smear with neg HR HPV 2013. Labs with Dr. Creta Levin earlier this year.  Vit D was low.  On OTC Vit D. Referral to Dr. Loreta Ave for screening colonoscopy. Dermatology referral.  (Pt no-showed appt with Dr. Sharyn Lull.  Letter received 07/25/13) return annually or prn  An After Visit Summary was printed and given to the patient.

## 2013-06-05 NOTE — Patient Instructions (Signed)

## 2013-07-22 ENCOUNTER — Telehealth: Payer: Self-pay | Admitting: Obstetrics & Gynecology

## 2013-07-22 NOTE — Telephone Encounter (Signed)
Message left to return call to Sharon Page at [phone].    

## 2013-07-22 NOTE — Telephone Encounter (Addendum)
Patient is calling for a referral

## 2013-07-23 NOTE — Telephone Encounter (Signed)
Patient requests referral to use [facility] for Colonoscopy. Has appointment with PA Lamonte Sakai [phone] fax.  Patient requests referral to this provider.

## 2013-10-31 DIAGNOSIS — Z86711 Personal history of pulmonary embolism: Secondary | ICD-10-CM | POA: Insufficient documentation

## 2013-10-31 DIAGNOSIS — Z800 Family history of malignant neoplasm of digestive organs: Secondary | ICD-10-CM | POA: Insufficient documentation

## 2013-11-12 HISTORY — PX: COLONOSCOPY: SHX174

## 2013-12-24 LAB — HM COLONOSCOPY

## 2014-03-25 ENCOUNTER — Telehealth: Payer: Self-pay | Admitting: Obstetrics & Gynecology

## 2014-03-25 NOTE — Telephone Encounter (Signed)
Spoke with patient. Advised that we recommend screening mammograms yearly. Patient's last mammogram was 04/07/13. Advised patient she will be due for another 9/15 of this year.Patient is agreeable and will call to schedule.  Routing to provider for final review. Patient agreeable to disposition. Will close encounter

## 2014-03-25 NOTE — Telephone Encounter (Signed)
Patient is asking if she needs to have a MMG this year? Patient thinks Dr.Miller told her she did need to have a MMG this year.

## 2014-04-20 ENCOUNTER — Other Ambulatory Visit: Payer: Self-pay

## 2014-04-20 DIAGNOSIS — Z1231 Encounter for screening mammogram for malignant neoplasm of breast: Secondary | ICD-10-CM

## 2014-04-27 ENCOUNTER — Ambulatory Visit
Admission: RE | Admit: 2014-04-27 | Discharge: 2014-04-27 | Disposition: A | Discharge status: Home / Self Care | Payer: BC Managed Care – PPO | Source: Ambulatory Visit

## 2014-04-27 DIAGNOSIS — Z1231 Encounter for screening mammogram for malignant neoplasm of breast: Secondary | ICD-10-CM

## 2014-05-25 ENCOUNTER — Encounter: Payer: Self-pay | Admitting: Obstetrics & Gynecology

## 2014-07-03 ENCOUNTER — Ambulatory Visit (INDEPENDENT_AMBULATORY_CARE_PROVIDER_SITE_OTHER): Payer: BC Managed Care – PPO | Admitting: Obstetrics & Gynecology

## 2014-07-03 ENCOUNTER — Encounter: Payer: Self-pay | Admitting: Obstetrics & Gynecology

## 2014-07-03 VITALS — BP 124/82 | HR 64 | Resp 16 | Ht 61.0 in | Wt 223.8 lb

## 2014-07-03 DIAGNOSIS — Z01419 Encounter for gynecological examination (general) (routine) without abnormal findings: Secondary | ICD-10-CM

## 2014-07-03 DIAGNOSIS — Z0000 Encounter for general adult medical examination without abnormal findings: Secondary | ICD-10-CM

## 2014-07-03 DIAGNOSIS — Z124 Encounter for screening for malignant neoplasm of cervix: Secondary | ICD-10-CM

## 2014-07-03 LAB — HEMOGLOBIN, FINGERSTICK: HEMOGLOBIN, FINGERSTICK: 13.3 g/dL (ref 12.0–16.0)

## 2014-07-03 LAB — COMPREHENSIVE METABOLIC PANEL
ALBUMIN: 4.2 g/dL (ref 3.5–5.2)
ALT: 15 U/L (ref 0–35)
AST: 18 U/L (ref 0–37)
Alkaline Phosphatase: 74 U/L (ref 39–117)
BUN: 16 mg/dL (ref 6–23)
CALCIUM: 9.3 mg/dL (ref 8.4–10.5)
CHLORIDE: 105 meq/L (ref 96–112)
CO2: 23 meq/L (ref 19–32)
Creat: 0.91 mg/dL (ref 0.50–1.10)
GLUCOSE: 96 mg/dL (ref 70–99)
POTASSIUM: 4.2 meq/L (ref 3.5–5.3)
SODIUM: 139 meq/L (ref 135–145)
TOTAL PROTEIN: 6.7 g/dL (ref 6.0–8.3)
Total Bilirubin: 0.3 mg/dL (ref 0.2–1.2)

## 2014-07-03 LAB — LIPID PANEL
CHOL/HDL RATIO: 2.2 ratio
CHOLESTEROL: 164 mg/dL (ref 0–200)
HDL: 74 mg/dL (ref >39)
LDL Cholesterol: 68 mg/dL (ref 0–99)
Triglycerides: 108 mg/dL (ref <150)
VLDL: 22 mg/dL (ref 0–40)

## 2014-07-03 LAB — POCT URINALYSIS DIPSTICK
BILIRUBIN UA: NEGATIVE
GLUCOSE UA: NEGATIVE
Ketones, UA: NEGATIVE
NITRITE UA: NEGATIVE
Protein, UA: NEGATIVE
RBC UA: NEGATIVE
Urobilinogen, UA: NEGATIVE
pH, UA: 5

## 2014-07-03 LAB — TSH: TSH: 1.12 u[IU]/mL (ref 0.350–4.500)

## 2014-07-03 NOTE — Progress Notes (Signed)
53 y.o. [id number] MarriedCaucasianF here for annual exam.  No vaginal bleeding.    Patient's last menstrual period was 12/28/2010.          Sexually active: Yes.    The current method of family planning is post menopausal status.    Exercising: Yes.    walking and strength exercises Smoker:  no  Health Maintenance: Pap:  02/21/12 WNL/negative HR HPV History of abnormal Pap:  yes MMG:  04/27/14 3D-normal Colonoscopy:  4/15-repeat in 10 years, Dr. Newman Pies at [facility] (reviewed through [facility]) BMD:   none TDaP:  5/08 Screening Labs: today, Hb today: 13.3, Urine today: WBC-1+   reports that she has never smoked. She has never used smokeless tobacco. She reports that she drinks about 3.0 oz of alcohol per week. She reports that she does not use illicit drugs.  Past Medical History  Diagnosis Date  . Pulmonary embolism   . Environmental allergies   . Hx gestational diabetes   . Nephrolithiasis 2001    Past Surgical History  Procedure Laterality Date  . Cholecystectomy    . Tonsillectomy    . Cesarean section    . Eye surgery      Current Outpatient Prescriptions  Medication Sig Dispense Refill  . cholecalciferol (VITAMIN D) 1000 UNITS tablet Take 1,000 Units by mouth daily.    . Cyanocobalamin (VITAMIN B-12 PO) Take by mouth daily.    Marland Kitchen EVENING PRIMROSE OIL PO Take by mouth.    . fish oil-omega-3 fatty acids 1000 MG capsule Take 2 g by mouth daily.    . Ginkgo Biloba 40 MG TABS Take by mouth.    . Multiple Vitamin (MULTIVITAMIN) tablet Take 1 tablet by mouth daily.      . vitamin C (ASCORBIC ACID) 500 MG tablet Take 500 mg by mouth daily.       No current facility-administered medications for this visit.    Family History  Problem Relation Age of Onset  . Diabetes Father   . Lung cancer Mother     lymphoma  . Skin cancer Maternal Grandmother   . Colon cancer Paternal Aunt     x2  . Hypertension Father   . Heart attack Brother     half brother  . Deep vein thrombosis Father    . Tuberculosis Mother     as a child    ROS:  Pertinent items are noted in HPI.  Otherwise, a comprehensive ROS was negative.  Exam:   BP 124/82 mmHg  Pulse 64  Resp 16  Ht 5\' 1"  (1.549 m)  Wt 223 lb 12.8 oz (101.515 kg)  BMI 42.31 kg/m2  LMP 12/28/2010  Weight: -1#   Height: 5\' 1"  (154.9 cm)  Ht Readings from Last 3 Encounters:  07/03/14 5\' 1"  (1.549 m)  06/05/13 5\' 1"  (1.549 m)  09/21/11 5\' 1"  (1.549 m)    General appearance: alert, cooperative and appears stated age Head: Normocephalic, without obvious abnormality, atraumatic Neck: no adenopathy, supple, symmetrical, trachea midline and thyroid normal to inspection and palpation Lungs: clear to auscultation bilaterally Breasts: normal appearance, no masses or tenderness Heart: regular rate and rhythm Abdomen: soft, non-tender; bowel sounds normal; no masses,  no organomegaly Extremities: extremities normal, atraumatic, no cyanosis or edema Skin: Skin color, texture, turgor normal. No rashes or lesions Lymph nodes: Cervical, supraclavicular, and axillary nodes normal. No abnormal inguinal nodes palpated Neurologic: Grossly normal   Pelvic: External genitalia:  no lesions  Urethra:  normal appearing urethra with no masses, tenderness or lesions              Bartholins and Skenes: normal                 Vagina: normal appearing vagina with normal color and discharge, no lesions              Cervix: no lesions              Pap taken: Yes.   Bimanual Exam:  Uterus:  enlarged, 8 weeks size              Adnexa: normal adnexa and no mass, fullness, tenderness               Rectovaginal: Confirms               Anus:  normal sphincter tone, no lesions  A:  Well Woman with normal exam PMP, no HRT H/O uterine fibroids.  Uterus is much smaller over last two years. Nephrolithiasis H/O pulmonary emboli 7/12. On coumadin x 1 year. Off now. Knows no HRT/estrogen products Dyspareunia the last two years Family hx  of colon cancer in two paternal aunts.  Recommended f/u 10 years.   P: Mammogram yearly. pap smear with neg HR HPV 2013.  Pap today. CMP, TSH, Vit D, Lipids return annually or prn  An After Visit Summary was printed and given to the patient.

## 2014-07-04 LAB — VITAMIN D 25 HYDROXY (VIT D DEFICIENCY, FRACTURES): VIT D 25 HYDROXY: 53 ng/mL (ref 30–100)

## 2014-07-07 LAB — IPS PAP TEST WITH REFLEX TO HPV

## 2014-07-10 ENCOUNTER — Telehealth: Payer: Self-pay

## 2014-07-10 NOTE — Telephone Encounter (Signed)
Patient notified see result note 

## 2014-07-10 NOTE — Telephone Encounter (Signed)
Lmtcb//kn 

## 2014-07-10 NOTE — Telephone Encounter (Signed)
-----   Message from Lyman Speller, MD sent at 07/07/2014  5:59 PM EST ----- Inform CMP, Lipids, TSH, Vit D normal.  Pap normal.  02 recall.

## 2015-04-02 ENCOUNTER — Other Ambulatory Visit: Payer: Self-pay

## 2015-04-02 DIAGNOSIS — Z1231 Encounter for screening mammogram for malignant neoplasm of breast: Secondary | ICD-10-CM

## 2015-04-29 ENCOUNTER — Ambulatory Visit
Admission: RE | Admit: 2015-04-29 | Discharge: 2015-04-29 | Disposition: A | Discharge status: Home / Self Care | Payer: 59 | Source: Ambulatory Visit

## 2015-04-29 DIAGNOSIS — Z1231 Encounter for screening mammogram for malignant neoplasm of breast: Secondary | ICD-10-CM

## 2015-09-03 ENCOUNTER — Ambulatory Visit: Payer: BC Managed Care – PPO | Admitting: Obstetrics & Gynecology

## 2015-09-10 ENCOUNTER — Ambulatory Visit (INDEPENDENT_AMBULATORY_CARE_PROVIDER_SITE_OTHER): Payer: 59 | Admitting: Obstetrics & Gynecology

## 2015-09-10 ENCOUNTER — Encounter: Payer: Self-pay | Admitting: Obstetrics & Gynecology

## 2015-09-10 VITALS — BP 130/70 | HR 66 | Resp 14 | Ht 61.25 in | Wt 237.0 lb

## 2015-09-10 DIAGNOSIS — Z124 Encounter for screening for malignant neoplasm of cervix: Secondary | ICD-10-CM | POA: Diagnosis not present

## 2015-09-10 DIAGNOSIS — Z01419 Encounter for gynecological examination (general) (routine) without abnormal findings: Secondary | ICD-10-CM | POA: Diagnosis not present

## 2015-09-10 DIAGNOSIS — Z0000 Encounter for general adult medical examination without abnormal findings: Secondary | ICD-10-CM

## 2015-09-10 LAB — CBC
HCT: 42.5 % (ref 36.0–46.0)
Hemoglobin: 13.9 g/dL (ref 12.0–15.0)
MCH: 28.8 pg (ref 26.0–34.0)
MCHC: 32.7 g/dL (ref 30.0–36.0)
MCV: 88 fL (ref 78.0–100.0)
MPV: 11.2 fL (ref 8.6–12.4)
PLATELETS: 208 10*3/uL (ref 150–400)
RBC: 4.83 MIL/uL (ref 3.87–5.11)
RDW: 13.7 % (ref 11.5–15.5)
WBC: 5.7 10*3/uL (ref 4.0–10.5)

## 2015-09-10 LAB — COMPREHENSIVE METABOLIC PANEL
ALT: 22 U/L (ref 6–29)
AST: 18 U/L (ref 10–35)
Albumin: 4.3 g/dL (ref 3.6–5.1)
Alkaline Phosphatase: 75 U/L (ref 33–130)
BUN: 10 mg/dL (ref 7–25)
CHLORIDE: 104 mmol/L (ref 98–110)
CO2: 25 mmol/L (ref 20–31)
CREATININE: 0.89 mg/dL (ref 0.50–1.05)
Calcium: 9.3 mg/dL (ref 8.6–10.4)
GLUCOSE: 84 mg/dL (ref 65–99)
Potassium: 3.9 mmol/L (ref 3.5–5.3)
SODIUM: 139 mmol/L (ref 135–146)
TOTAL PROTEIN: 7 g/dL (ref 6.1–8.1)
Total Bilirubin: 0.4 mg/dL (ref 0.2–1.2)

## 2015-09-10 LAB — POCT URINALYSIS DIPSTICK
Bilirubin, UA: NEGATIVE
Blood, UA: NEGATIVE
Glucose, UA: NEGATIVE
KETONES UA: NEGATIVE
Nitrite, UA: NEGATIVE
PROTEIN UA: NEGATIVE
Urobilinogen, UA: NEGATIVE
pH, UA: 6

## 2015-09-10 LAB — LIPID PANEL
CHOL/HDL RATIO: 2.4 ratio (ref <=5.0)
Cholesterol: 191 mg/dL (ref 125–200)
HDL: 79 mg/dL (ref >=46)
LDL CALC: 89 mg/dL (ref <130)
Triglycerides: 115 mg/dL (ref <150)
VLDL: 23 mg/dL (ref <30)

## 2015-09-10 LAB — HEMOGLOBIN, FINGERSTICK: Hemoglobin, fingerstick: 13.9 g/dL (ref 12.0–16.0)

## 2015-09-10 LAB — TSH: TSH: 1.92 m[IU]/L

## 2015-09-10 NOTE — Progress Notes (Signed)
55 y.o. [id number] MarriedCaucasianF here for annual exam.  Doing well.  Oldest daughter is in [place].  One of her twins got a job with [employer] in March.  Other daughter will graduate in May from [employer].  Denies vaginal bleeding.    Now taking of her 38 year old father.  He was in an assisted living situation.  Pt states she just doesn't feel good.  She quit her job to start take care of her dad.  States job got really stressful.  Pt feels "house bound".  Not really interested in treatment.  [place] suggested.    PCP:  Dr. Tollie Pizza  Patient's last menstrual period was 12/28/2010.          Sexually active: Yes.    The current method of family planning is post menopausal status.    Exercising: Yes.    Walking Smoker:  no  Health Maintenance: Pap:  07/03/14 Neg, 02/21/12 neg with HR HPV History of abnormal Pap:  yes MMG: 05/03/15 BIRADS1:Neg Colonoscopy:  10/2013 Normal - f/u 10 years  BMD:   Never TDaP:  11/2006 Screening Labs: Here, Hb today: 13.9, Urine today: WBC=Trace   reports that she has never smoked. She has never used smokeless tobacco. She reports that she drinks about 3.0 oz of alcohol per week. She reports that she does not use illicit drugs.  Past Medical History  Diagnosis Date  . Pulmonary embolism ([place])   . Environmental allergies   . Hx gestational diabetes   . Nephrolithiasis 2001    Past Surgical History  Procedure Laterality Date  . Cholecystectomy    . Tonsillectomy    . Cesarean section    . Eye surgery      Current Outpatient Prescriptions  Medication Sig Dispense Refill  . cholecalciferol (VITAMIN D) 1000 UNITS tablet Take 1,000 Units by mouth daily.    . Cyanocobalamin (VITAMIN B-12 PO) Take by mouth daily.    Marland Kitchen EVENING PRIMROSE OIL PO Take by mouth.    . fish oil-omega-3 fatty acids 1000 MG capsule Take 2 g by mouth daily.    . Ginkgo Biloba 40 MG TABS Take by mouth.    . Multiple Vitamin (MULTIVITAMIN) tablet Take 1 tablet by mouth daily.      .  vitamin C (ASCORBIC ACID) 500 MG tablet Take 500 mg by mouth daily.       No current facility-administered medications for this visit.    Family History  Problem Relation Age of Onset  . Diabetes Father   . Lung cancer Mother     lymphoma  . Skin cancer Maternal Grandmother   . Colon cancer Paternal Aunt     x2  . Hypertension Father   . Heart attack Brother     half brother  . Deep vein thrombosis Father   . Tuberculosis Mother     as a child    ROS:  Pertinent items are noted in HPI.  Otherwise, a comprehensive ROS was negative.  Exam:   BP 130/70 mmHg  Pulse 66  Resp 14  Ht 5' 1.25" (1.556 m)  Wt 237 lb (107.502 kg)  BMI 44.40 kg/m2  LMP 12/28/2010  Weight change:  +13# Height: 5' 1.25" (155.6 cm)  Ht Readings from Last 3 Encounters:  09/10/15 5' 1.25" (1.556 m)  07/03/14 5\' 1"  (1.549 m)  06/05/13 5\' 1"  (1.549 m)   General appearance: alert, cooperative and appears stated age Head: Normocephalic, without obvious abnormality, atraumatic Neck: no  adenopathy, supple, symmetrical, trachea midline and thyroid normal to inspection and palpation Lungs: clear to auscultation bilaterally Breasts: normal appearance, no masses or tenderness Heart: regular rate and rhythm Abdomen: soft, non-tender; bowel sounds normal; no masses,  no organomegaly Extremities: extremities normal, atraumatic, no cyanosis or edema Skin: Skin color, texture, turgor normal. No rashes or lesions Lymph nodes: Cervical, supraclavicular, and axillary nodes normal. No abnormal inguinal nodes palpated Neurologic: Grossly normal  Pelvic: External genitalia:  no lesions              Urethra:  normal appearing urethra with no masses, tenderness or lesions              Bartholins and Skenes: normal                 Vagina: normal appearing vagina with normal color and discharge, no lesions              Cervix: no lesions              Pap taken: Yes.   Bimanual Exam:  Uterus:  normal size, contour,  position, consistency, mobility, non-tender              Adnexa: normal adnexa and no mass, fullness, tenderness               Rectovaginal: Confirms               Anus:  normal sphincter tone, no lesions  Chaperone was present for exam.  A:  Well Woman with normal exam PMP, no HRT H/O uterine fibroids with decreased uterine size since entering menopause Nephrolithiasis hx H/O pulmonary emboli 7/12. On coumadin x 1 year. Off now. Knows no HRT/estrogen products Dyspareunia  Family hx of colon cancer in two paternal aunts. Recommended f/u 10 years.   P: Mammogram yearly pap smear with neg HR HPV 2013. Pap with HR HPV today. CMP, TSH, Vit D, Lipids, CBC Trial of St. John's Wort discussed return annually or prn

## 2015-09-11 LAB — VITAMIN D 25 HYDROXY (VIT D DEFICIENCY, FRACTURES): VIT D 25 HYDROXY: 49 ng/mL (ref 30–100)

## 2015-09-14 LAB — IPS PAP TEST WITH HPV

## 2015-12-15 ENCOUNTER — Telehealth: Payer: Self-pay | Admitting: Obstetrics & Gynecology

## 2015-12-15 NOTE — Telephone Encounter (Signed)
Spoke with patient. Advised of message as seen below from Dr.Miller. Patient is agreeable and verbalizes understanding.  Routing to provider for final review. Patient agreeable to disposition. Will close encounter   

## 2015-12-15 NOTE — Telephone Encounter (Signed)
Spoke with patient. Advised Dr.Miller is out of the office today. Requests I review this with Dr.Miller tomorrow.Routing to [place] for review and advise.

## 2015-12-15 NOTE — Telephone Encounter (Signed)
Patient called requesting to speak with the nurse. She said, "I saw an ad for Menoquil, over the counter supplement, and am wondering if Dr. Sabra Heck recommends this for help with menopause."

## 2015-12-15 NOTE — Telephone Encounter (Signed)
I reviewed ingredients of this supplement.  Menoquil contains soy, dong quai, black cohosh, yam root, which are common supplements used for symptom relief in menopause.  These contain phytoestrogens (plant derived estrogens).  She's had a pulmonary emboli so I don't think this is a good idea for her.  This will increase her clot risks if she takes it.  I'm sorry.

## 2016-04-03 ENCOUNTER — Other Ambulatory Visit: Payer: Self-pay | Admitting: Obstetrics & Gynecology

## 2016-04-03 DIAGNOSIS — Z1231 Encounter for screening mammogram for malignant neoplasm of breast: Secondary | ICD-10-CM

## 2016-05-01 ENCOUNTER — Ambulatory Visit: Payer: 59

## 2016-05-10 ENCOUNTER — Ambulatory Visit
Admission: RE | Admit: 2016-05-10 | Discharge: 2016-05-10 | Disposition: A | Discharge status: Home / Self Care | Payer: 59 | Source: Ambulatory Visit | Attending: Obstetrics & Gynecology | Admitting: Obstetrics & Gynecology

## 2016-05-10 DIAGNOSIS — Z1231 Encounter for screening mammogram for malignant neoplasm of breast: Secondary | ICD-10-CM

## 2016-12-08 ENCOUNTER — Ambulatory Visit (INDEPENDENT_AMBULATORY_CARE_PROVIDER_SITE_OTHER): Payer: 59 | Admitting: Obstetrics & Gynecology

## 2016-12-08 ENCOUNTER — Encounter: Payer: Self-pay | Admitting: Obstetrics & Gynecology

## 2016-12-08 VITALS — BP 124/88 | HR 78 | Resp 16 | Ht 60.75 in | Wt 221.2 lb

## 2016-12-08 DIAGNOSIS — Z23 Encounter for immunization: Secondary | ICD-10-CM | POA: Diagnosis not present

## 2016-12-08 DIAGNOSIS — Z01419 Encounter for gynecological examination (general) (routine) without abnormal findings: Secondary | ICD-10-CM | POA: Diagnosis not present

## 2016-12-08 DIAGNOSIS — Z205 Contact with and (suspected) exposure to viral hepatitis: Secondary | ICD-10-CM

## 2016-12-08 DIAGNOSIS — N950 Postmenopausal bleeding: Secondary | ICD-10-CM | POA: Diagnosis not present

## 2016-12-08 MED ORDER — NONFORMULARY OR COMPOUNDED ITEM: 3 refills | Status: DC

## 2016-12-08 NOTE — Progress Notes (Signed)
56 y.o. [id number] MarriedCaucasianF here for annual exam.  Doing well except today she started having some vaginal bleeding.  Pt felt some nausea yesterday and felt like she was going to start her cycle.  Bleeding is bright red.  She is not having cramping or passing clots.  Still having some vaginal dryness and painful intercourse.  Wants to discuss this today.    Patient's last menstrual period was 12/28/2010.          Sexually active: Yes.    The current method of family planning is post menopausal status.    Exercising: Yes.    Walking, Cardio Smoker:  no  Health Maintenance: Pap: 09/10/15 Neg: Neg HR HPV; 07/03/14 Neg History of abnormal Pap:  Yes, ~ 1998 [place]-normal  MMG: 05/10/16 BIRADS1, [facility] Colonoscopy: 4/15.  Follow up 10 years. BMD: No TDaP: 11/2006 Pneumonia vaccine(s):  No Zostavax:   No Hep C testing: No Screening Labs:    reports that she has never smoked. She has never used smokeless tobacco. She reports that she drinks about 3.0 oz of alcohol per week . She reports that she does not use drugs.  Past Medical History:  Diagnosis Date  . Environmental allergies   . Hx gestational diabetes   . Nephrolithiasis 2001  . Pulmonary embolism [facility])     Past Surgical History:  Procedure Laterality Date  . CESAREAN SECTION    . CHOLECYSTECTOMY    . EYE SURGERY    . TONSILLECTOMY      Current Outpatient Prescriptions  Medication Sig Dispense Refill  . cholecalciferol (VITAMIN D) 1000 UNITS tablet Take 1,000 Units by mouth daily.    . Cyanocobalamin (VITAMIN B-12 PO) Take by mouth daily.    Marland Kitchen EVENING PRIMROSE OIL PO Take by mouth.    . fish oil-omega-3 fatty acids 1000 MG capsule Take 2 g by mouth daily.    . Ginkgo Biloba 40 MG TABS Take by mouth.    . Multiple Vitamin (MULTIVITAMIN) tablet Take 1 tablet by mouth daily.      . vitamin C (ASCORBIC ACID) 500 MG tablet Take 500 mg by mouth daily.       No current facility-administered medications  for this visit.     Family History  Problem Relation Age of Onset  . Diabetes Father   . Lung cancer Mother        lymphoma  . Skin cancer Maternal Grandmother   . Colon cancer Paternal Aunt        x2  . Hypertension Father   . Heart attack Brother        half brother  . Deep vein thrombosis Father   . Tuberculosis Mother        as a child    ROS:  Pertinent items are noted in HPI.  Otherwise, a comprehensive ROS was negative.  Exam:   BP 124/88 (BP Location: Right Arm, Patient Position: Sitting, Cuff Size: Normal)   Pulse 78   Resp 16   Ht 5' 0.75" (1.543 m)   Wt 221 lb 3.2 oz (100.3 kg)   LMP 12/28/2010   BMI 42.14 kg/m   Weight change: -16#  Height: 5' 0.75" (154.3 cm)  Ht Readings from Last 3 Encounters:  12/08/16 5' 0.75" (1.543 m)  09/10/15 5' 1.25" (1.556 m)  07/03/14 5\' 1"  (1.549 m)   General appearance: alert, cooperative and appears stated age Head: Normocephalic, without obvious abnormality, atraumatic Neck: no adenopathy, supple, symmetrical, trachea  midline and thyroid normal to inspection and palpation Lungs: clear to auscultation bilaterally Breasts: normal appearance, no masses or tenderness Heart: regular rate and rhythm Abdomen: soft, non-tender; bowel sounds normal; no masses,  no organomegaly Extremities: extremities normal, atraumatic, no cyanosis or edema Skin: Skin color, texture, turgor normal. No rashes or lesions Lymph nodes: Cervical, supraclavicular, and axillary nodes normal. No abnormal inguinal nodes palpated Neurologic: Grossly normal   Pelvic: External genitalia:  no lesions              Urethra:  normal appearing urethra with no masses, tenderness or lesions              Bartholins and Skenes: normal                 Vagina: normal appearing vagina with normal color and discharge, no lesions              Cervix: no lesions              Pap taken: No. Bimanual Exam:  Uterus: about 8 weeks and globular              Adnexa: normal  adnexa and no mass, fullness, tenderness               Rectovaginal: Confirms               Anus:  normal sphincter tone, no lesions  Endometrial biopsy recommended.  Discussed with patient.  Verbal and written consent obtained.   Procedure:  Speculum placed.  Cervix visualized and cleansed with betadine prep.  A single toothed tenaculum was applied to the anterior lip of the cervix.  Endometrial pipelle was advanced through the cervix into the endometrial cavity without difficulty.  Pipelle passed to 7cm.  Suction applied and pipelle removed with good tissue sample obtained.  Tenculum removed.  No bleeding noted.  Patient tolerated procedure well.  Chaperone was present for exam.  A:  Well Woman with normal exam Active PMP bleeding H/o uterine fibroids H/o nephrolithiasis H/o pulmonary emboli 7/12.  Was on coumadin for 1 year.  Off anticoagulation now.   Vaginal atrophic changes/dyspareunia H/o colon cancer in two paternal aunts.  Follow up was recommended in 10 years from last one.  P:   Mammogram guidelines reviewed pap smear not indicated Endometrial biopsy obtained today FSH obtained today Hep C antibody  Trial of Vit E vaginal suppositories, two to three times weekly.  #36/3RF.  She will give update. return annually or prn

## 2016-12-09 LAB — HEPATITIS C ANTIBODY: HCV AB: NEGATIVE

## 2016-12-09 LAB — FOLLICLE STIMULATING HORMONE: FSH: 84.8 m[IU]/mL

## 2016-12-12 ENCOUNTER — Telehealth: Payer: Self-pay | Admitting: Obstetrics & Gynecology

## 2016-12-12 NOTE — Telephone Encounter (Signed)
Patient says the prescription for Vitamin E suppositories is not at [facility]. She does not remember if it was going to be faxed over or if she was supposed to get a written prescription.

## 2016-12-12 NOTE — Telephone Encounter (Signed)
Rx for Vit E sent to [facility] on 5/18 . RX for Vit E faxed to [facility] at [phone] on 5/22. Will notify patient.  Spoke with patient, advised as seen above. Patient will f/u with [facility] for filling. Patient is agreeable and thankful for f/u.  Routing to provider for final review. Patient is agreeable to disposition. Will close encounter.

## 2016-12-14 ENCOUNTER — Other Ambulatory Visit: Payer: Self-pay | Admitting: *Deleted

## 2016-12-14 DIAGNOSIS — N950 Postmenopausal bleeding: Secondary | ICD-10-CM

## 2016-12-21 ENCOUNTER — Encounter: Payer: Self-pay | Admitting: Obstetrics & Gynecology

## 2016-12-21 ENCOUNTER — Ambulatory Visit (INDEPENDENT_AMBULATORY_CARE_PROVIDER_SITE_OTHER): Payer: 59 | Admitting: Obstetrics & Gynecology

## 2016-12-21 ENCOUNTER — Other Ambulatory Visit: Payer: Self-pay | Admitting: Obstetrics & Gynecology

## 2016-12-21 ENCOUNTER — Ambulatory Visit (INDEPENDENT_AMBULATORY_CARE_PROVIDER_SITE_OTHER): Payer: 59

## 2016-12-21 VITALS — BP 140/88 | HR 64 | Resp 14 | Ht 60.75 in | Wt 221.0 lb

## 2016-12-21 DIAGNOSIS — N9489 Other specified conditions associated with female genital organs and menstrual cycle: Secondary | ICD-10-CM | POA: Diagnosis not present

## 2016-12-21 DIAGNOSIS — N950 Postmenopausal bleeding: Secondary | ICD-10-CM

## 2016-12-21 DIAGNOSIS — D251 Intramural leiomyoma of uterus: Secondary | ICD-10-CM

## 2016-12-21 NOTE — Progress Notes (Signed)
56 y.o. Sharon Page Marriedfemale here for a pelvic ultrasound with sonohystogram due to episode of PMP that has been evaluated with endometrial biopsy showing atrophic tissue and FSH in the 80's range.  At this point, I do not have a reason for the bleeding.    Patient's last menstrual period was 12/28/2010.  Contraception:  PMP  Technique:  Both transabdominal and transvaginal ultrasound examinations of the pelvis were performed. Transabdominal technique was performed for global imaging of the pelvis including uterus, ovaries, adnexal regions, and pelvic cul-de-sac.  It was necessary to proceed with endovaginal exam following the abdominal ultrasound transabdominal exam to visualize the endometrium and adnexa.  Color and duplex Doppler ultrasound was utilized to evaluate blood flow to the ovaries.    FINDINGS: Uterus: 8.0 x 4.5 x 3.8cm with 1.7 x 2.2cm and 2.0 x 2.3cm intramural fibroids Endometrium: 14.48mm with 36 x 13mm mass noted Adnexa:  Left: 1.5 x 1.1 x 0.6cm     Right: 2.0 x 1.0 x 1.2cm Cul de sac: no free fluid  SHSG:  After obtaining appropriate verbal consent from patient, the cervix was visualized using a speculum, and prepped with betadine.  A tenaculum  was not applied to the cervix.  Dilation of the cervix was not necessary. The catheter was passed into the uterus and sterile saline introduced, with the following findings: large polyp appearing lesion that completely fills the endometrial cavity.  Findings reviewed with pt.  Due to amount of bleeding she had and the size of the probable polyp, feel removal is appropriate.  Procedure discussed with patient.  Recovery and pain management discussed.  Risks discussed including but not limited to bleeding, rare risk of transfusion, infection, 1% risk of uterine perforation with risks of fluid deficit causing cardiac arrythmia, cerebral swelling and/or need to stop procedure early.  Fluid emboli and rare risk of death discussed.  DVT/PE, rare  risk of risk of bowel/bladder/ureteral/vascular injury.  Patient aware if pathology abnormal she may need additional treatment.  All questions answered.  Informational brochure given.  Physical Exam  Constitutional: She is oriented to person, place, and time. She appears well-developed and well-nourished.  Cardiovascular: Normal rate and regular rhythm.   Respiratory: Effort normal and breath sounds normal.  Neurological: She is alert and oriented to person, place, and time.  Skin: Skin is warm and dry.  Psychiatric: She has a normal mood and affect.   Assessment: PMP bleeding Intramural fibroids Large endometrial polyp, likely, that fills entire endometrial cavity H/O PE  Plan:   Hysteroscopy with polyp resection, D&C will be planned.  Will give Lovenox day of surgery due to hx of PE  ~25 minutes spent with patient >50% of time was in face to face discussion of above.

## 2016-12-22 ENCOUNTER — Other Ambulatory Visit: Payer: Self-pay | Admitting: Obstetrics & Gynecology

## 2016-12-25 ENCOUNTER — Telehealth: Payer: Self-pay | Admitting: Obstetrics & Gynecology

## 2016-12-25 NOTE — Telephone Encounter (Signed)
Returned call to patient. She wanted to clarify what supplements she could continue to take. Advised should discontinue all supplements one week prior to surgery. May take multi vitamin if does not exceed 100% RDA. Advised  Dr Sabra Heck will review call and will call her back if any change in recommendations.  Routing to provider for final review. Patient agreeable to disposition. Will close encounter.

## 2016-12-25 NOTE — Telephone Encounter (Signed)
Patient takes a lot of herbal supplements and wants to know which ones she can take prior to the surgery.  Surgery is set for 01/08/17

## 2016-12-26 ENCOUNTER — Telehealth: Payer: Self-pay | Admitting: Obstetrics & Gynecology

## 2016-12-26 DIAGNOSIS — M7661 Achilles tendinitis, right leg: Secondary | ICD-10-CM | POA: Diagnosis not present

## 2016-12-26 NOTE — Telephone Encounter (Signed)
Call to patient. Advised of response from Dr Miller. Encounter closed.   

## 2016-12-26 NOTE — Telephone Encounter (Signed)
Call placed to patient to review benefit information for scheduled surgery. During this conversation patient mentioned that she was placed on a "nine day course of prednisone", by a provider at "[facility]". Patient wants to know if this will interfere with up coming surgery. Advised patient. her question will be forwarded to her doctor for review. Patient is agreeable to a return call  Routing to Dr Sabra Heck  cc: Lamont Snowball

## 2016-12-26 NOTE — Telephone Encounter (Signed)
No it should not as she will be done with it before her surgery.  Will route to Gay Filler to contact patient.  Thanks.

## 2016-12-29 ENCOUNTER — Ambulatory Visit: Payer: 59 | Admitting: Obstetrics & Gynecology

## 2016-12-29 NOTE — Patient Instructions (Addendum)
Your procedure is scheduled on:  Monday, June 18  Enter through the Main Entrance of [facility] at: 9:30 am  Pick up the phone at the desk and dial [phone].  Call this number if you have problems the morning of surgery: [phone].  Remember: Do NOT eat or drink (including water) after midnight Sunday.  Take these medicines the morning of surgery with a SIP OF WATER:  None  Do not smoke on day of surgery.  Stop all herbal medications and supplements at this time.  Do NOT wear jewelry (body piercing), metal hair clips/bobby pins, make-up, or nail polish. Do NOT wear lotions, powders, or perfumes.  You may wear deoderant. Do NOT shave for 48 hours prior to surgery. Do NOT bring valuables to the hospital.  Have a responsible adult drive you home and stay with you for 24 hours after your procedure.  Home with husband Sharon Page cell [phone].

## 2017-01-01 DIAGNOSIS — R079 Chest pain, unspecified: Secondary | ICD-10-CM | POA: Diagnosis not present

## 2017-01-01 DIAGNOSIS — K449 Diaphragmatic hernia without obstruction or gangrene: Secondary | ICD-10-CM | POA: Diagnosis not present

## 2017-01-01 DIAGNOSIS — Z7901 Long term (current) use of anticoagulants: Secondary | ICD-10-CM | POA: Diagnosis not present

## 2017-01-01 DIAGNOSIS — R531 Weakness: Secondary | ICD-10-CM | POA: Diagnosis not present

## 2017-01-01 DIAGNOSIS — M542 Cervicalgia: Secondary | ICD-10-CM | POA: Diagnosis not present

## 2017-01-03 ENCOUNTER — Encounter (HOSPITAL_COMMUNITY): Payer: Self-pay

## 2017-01-03 ENCOUNTER — Encounter (HOSPITAL_COMMUNITY)
Admission: RE | Admit: 2017-01-03 | Discharge: 2017-01-03 | Disposition: A | Discharge status: Home / Self Care | Payer: 59 | Source: Ambulatory Visit | Attending: Obstetrics & Gynecology | Admitting: Obstetrics & Gynecology

## 2017-01-03 DIAGNOSIS — Z01812 Encounter for preprocedural laboratory examination: Secondary | ICD-10-CM | POA: Diagnosis not present

## 2017-01-03 HISTORY — DX: Personal history of urinary calculi: Z87.442

## 2017-01-03 HISTORY — DX: Gastro-esophageal reflux disease without esophagitis: K21.9

## 2017-01-03 HISTORY — DX: Achilles tendinitis, unspecified leg: M76.60

## 2017-01-03 NOTE — Pre-Procedure Instructions (Signed)
No labs drawn at PAT appt.  Current labs in Care Everywhere dated 01/01/17.

## 2017-01-08 ENCOUNTER — Encounter (HOSPITAL_COMMUNITY): Payer: Self-pay

## 2017-01-08 ENCOUNTER — Other Ambulatory Visit: Payer: Self-pay | Admitting: Obstetrics & Gynecology

## 2017-01-08 ENCOUNTER — Encounter (HOSPITAL_COMMUNITY)
Admission: RE | Disposition: A | Discharge status: Home / Self Care | Payer: Self-pay | Source: Ambulatory Visit | Attending: Obstetrics & Gynecology

## 2017-01-08 ENCOUNTER — Ambulatory Visit (HOSPITAL_COMMUNITY): Payer: 59 | Admitting: Anesthesiology

## 2017-01-08 ENCOUNTER — Ambulatory Visit (HOSPITAL_COMMUNITY)
Admission: RE | Admit: 2017-01-08 | Discharge: 2017-01-08 | Disposition: A | Discharge status: Home / Self Care | Payer: 59 | Source: Ambulatory Visit | Attending: Obstetrics & Gynecology | Admitting: Obstetrics & Gynecology

## 2017-01-08 DIAGNOSIS — N950 Postmenopausal bleeding: Secondary | ICD-10-CM | POA: Diagnosis not present

## 2017-01-08 DIAGNOSIS — Z79899 Other long term (current) drug therapy: Secondary | ICD-10-CM | POA: Diagnosis not present

## 2017-01-08 DIAGNOSIS — N840 Polyp of corpus uteri: Secondary | ICD-10-CM | POA: Diagnosis not present

## 2017-01-08 DIAGNOSIS — K219 Gastro-esophageal reflux disease without esophagitis: Secondary | ICD-10-CM | POA: Insufficient documentation

## 2017-01-08 DIAGNOSIS — Z86711 Personal history of pulmonary embolism: Secondary | ICD-10-CM | POA: Diagnosis not present

## 2017-01-08 DIAGNOSIS — I517 Cardiomegaly: Secondary | ICD-10-CM

## 2017-01-08 DIAGNOSIS — Z87442 Personal history of urinary calculi: Secondary | ICD-10-CM | POA: Diagnosis not present

## 2017-01-08 HISTORY — PX: DILATATION & CURETTAGE/HYSTEROSCOPY WITH MYOSURE: SHX6511

## 2017-01-08 SURGERY — DILATATION & CURETTAGE/HYSTEROSCOPY WITH MYOSURE
Anesthesia: General | Site: Vagina

## 2017-01-08 MED ORDER — FENTANYL CITRATE (PF) 100 MCG/2ML IJ SOLN: 25.0000 ug | INTRAMUSCULAR | Status: DC | PRN

## 2017-01-08 MED ORDER — ACETAMINOPHEN 325 MG PO TABS: 325.0000 mg | ORAL_TABLET | ORAL | Status: DC | PRN

## 2017-01-08 MED ORDER — ENOXAPARIN SODIUM 40 MG/0.4ML ~~LOC~~ SOLN
40.0000 mg | SUBCUTANEOUS | Status: AC
  Administered 2017-01-08: 40 mg via SUBCUTANEOUS
  Filled 2017-01-08: qty 0.4

## 2017-01-08 MED ORDER — ONDANSETRON HCL 4 MG/2ML IJ SOLN: 4.0000 mg | Freq: Once | INTRAMUSCULAR | Status: DC | PRN

## 2017-01-08 MED ORDER — LIDOCAINE HCL (CARDIAC) 20 MG/ML IV SOLN
INTRAVENOUS | Status: AC
  Filled 2017-01-08: qty 5

## 2017-01-08 MED ORDER — MIDAZOLAM HCL 2 MG/2ML IJ SOLN
INTRAMUSCULAR | Status: AC
  Filled 2017-01-08: qty 2

## 2017-01-08 MED ORDER — LIDOCAINE 2% (20 MG/ML) 5 ML SYRINGE
INTRAMUSCULAR | Status: DC | PRN
Start: 2017-01-08 — End: 2017-01-08
  Administered 2017-01-08: 60 mg via INTRAVENOUS

## 2017-01-08 MED ORDER — FENTANYL CITRATE (PF) 100 MCG/2ML IJ SOLN
INTRAMUSCULAR | Status: DC | PRN
  Administered 2017-01-08 (×2): 50 ug via INTRAVENOUS

## 2017-01-08 MED ORDER — TRAMADOL HCL 50 MG PO TABS: ORAL_TABLET | ORAL | 0 refills | Status: DC

## 2017-01-08 MED ORDER — LIDOCAINE-EPINEPHRINE 1 %-1:100000 IJ SOLN
INTRAMUSCULAR | Status: DC | PRN
  Administered 2017-01-08: 10 mL

## 2017-01-08 MED ORDER — ONDANSETRON HCL 4 MG/2ML IJ SOLN
INTRAMUSCULAR | Status: DC | PRN
  Administered 2017-01-08: 4 mg via INTRAVENOUS

## 2017-01-08 MED ORDER — LACTATED RINGERS IV SOLN
INTRAVENOUS | Status: DC
  Administered 2017-01-08 (×2): via INTRAVENOUS

## 2017-01-08 MED ORDER — PROPOFOL 10 MG/ML IV BOLUS
INTRAVENOUS | Status: AC
  Filled 2017-01-08: qty 20

## 2017-01-08 MED ORDER — SCOPOLAMINE 1 MG/3DAYS TD PT72
1.0000 | MEDICATED_PATCH | Freq: Once | TRANSDERMAL | Status: DC
  Administered 2017-01-08: 1.5 mg via TRANSDERMAL

## 2017-01-08 MED ORDER — LIDOCAINE-EPINEPHRINE 1 %-1:100000 IJ SOLN
INTRAMUSCULAR | Status: AC
  Filled 2017-01-08: qty 1

## 2017-01-08 MED ORDER — ACETAMINOPHEN 160 MG/5ML PO SOLN: 325.0000 mg | ORAL | Status: DC | PRN

## 2017-01-08 MED ORDER — FENTANYL CITRATE (PF) 100 MCG/2ML IJ SOLN
INTRAMUSCULAR | Status: AC
  Filled 2017-01-08: qty 2

## 2017-01-08 MED ORDER — SODIUM CHLORIDE 0.9 % IR SOLN
Status: DC | PRN
  Administered 2017-01-08: 3000 mL

## 2017-01-08 MED ORDER — MIDAZOLAM HCL 5 MG/5ML IJ SOLN
INTRAMUSCULAR | Status: DC | PRN
  Administered 2017-01-08: 2 mg via INTRAVENOUS

## 2017-01-08 MED ORDER — DEXAMETHASONE SODIUM PHOSPHATE 10 MG/ML IJ SOLN
INTRAMUSCULAR | Status: AC
  Filled 2017-01-08: qty 1

## 2017-01-08 MED ORDER — SCOPOLAMINE 1 MG/3DAYS TD PT72
MEDICATED_PATCH | TRANSDERMAL | Status: AC
  Administered 2017-01-08: 1.5 mg via TRANSDERMAL
  Filled 2017-01-08: qty 1

## 2017-01-08 MED ORDER — MEPERIDINE HCL 25 MG/ML IJ SOLN: 6.2500 mg | INTRAMUSCULAR | Status: DC | PRN

## 2017-01-08 MED ORDER — KETOROLAC TROMETHAMINE 30 MG/ML IJ SOLN: 30.0000 mg | Freq: Once | INTRAMUSCULAR | Status: DC | PRN

## 2017-01-08 MED ORDER — PROPOFOL 10 MG/ML IV BOLUS
INTRAVENOUS | Status: DC | PRN
  Administered 2017-01-08: 200 mg via INTRAVENOUS

## 2017-01-08 MED ORDER — DEXAMETHASONE SODIUM PHOSPHATE 4 MG/ML IJ SOLN
INTRAMUSCULAR | Status: DC | PRN
  Administered 2017-01-08: 10 mg via INTRAVENOUS

## 2017-01-08 MED ORDER — KETOROLAC TROMETHAMINE 30 MG/ML IJ SOLN
INTRAMUSCULAR | Status: DC | PRN
  Administered 2017-01-08: 30 mg via INTRAVENOUS

## 2017-01-08 MED ORDER — ONDANSETRON HCL 4 MG/2ML IJ SOLN
INTRAMUSCULAR | Status: AC
  Filled 2017-01-08: qty 2

## 2017-01-08 SURGICAL SUPPLY — 18 items
CANISTER SUCT 3000ML PPV (MISCELLANEOUS) ×3 IMPLANT
CATH ROBINSON RED A/P 16FR (CATHETERS) ×2 IMPLANT
CLOTH BEACON ORANGE TIMEOUT ST (SAFETY) ×2 IMPLANT
CONTAINER PREFILL 10% NBF 60ML (FORM) ×4 IMPLANT
DEVICE MYOSURE LITE (MISCELLANEOUS) ×1 IMPLANT
DEVICE MYOSURE REACH (MISCELLANEOUS) IMPLANT
DILATOR CANAL MILEX (MISCELLANEOUS) IMPLANT
FILTER ARTHROSCOPY CONVERTOR (FILTER) ×2 IMPLANT
GLOVE BIOGEL PI IND STRL 7.0 (GLOVE) ×2 IMPLANT
GLOVE BIOGEL PI INDICATOR 7.0 (GLOVE) ×2
GLOVE ECLIPSE 6.5 STRL STRAW (GLOVE) ×2 IMPLANT
GOWN STRL REUS W/TWL LRG LVL3 (GOWN DISPOSABLE) ×4 IMPLANT
PACK VAGINAL MINOR WOMEN LF (CUSTOM PROCEDURE TRAY) ×2 IMPLANT
PAD OB MATERNITY 4.3X12.25 (PERSONAL CARE ITEMS) ×2 IMPLANT
SEAL ROD LENS SCOPE MYOSURE (ABLATOR) ×2 IMPLANT
TOWEL OR 17X24 6PK STRL BLUE (TOWEL DISPOSABLE) ×4 IMPLANT
TUBING AQUILEX INFLOW (TUBING) ×2 IMPLANT
TUBING AQUILEX OUTFLOW (TUBING) ×2 IMPLANT

## 2017-01-08 NOTE — Anesthesia Preprocedure Evaluation (Signed)
Anesthesia Evaluation  Patient identified by MRN, date of birth, ID band Patient awake    Reviewed: Allergy & Precautions, H&P , NPO status , Patient's Chart, lab work & pertinent test results  Airway Mallampati: II  TM Distance: >3 FB Neck ROM: full    Dental no notable dental hx. (+) Teeth Intact   Pulmonary neg pulmonary ROS,    Pulmonary exam normal breath sounds clear to auscultation       Cardiovascular negative cardio ROS Normal cardiovascular exam Rhythm:regular Rate:Normal     Neuro/Psych negative neurological ROS  negative psych ROS   GI/Hepatic negative GI ROS, Neg liver ROS,   Endo/Other  Morbid obesity  Renal/GU      Musculoskeletal   Abdominal (+) + obese,   Peds  Hematology negative hematology ROS (+)   Anesthesia Other Findings   Reproductive/Obstetrics negative OB ROS                             Anesthesia Physical Anesthesia Plan  ASA: III  Anesthesia Plan: General   Post-op Pain Management:    Induction:   PONV Risk Score and Plan: 4 or greater and Ondansetron, Dexamethasone, Propofol, Midazolam and Scopolamine patch - Pre-op  Airway Management Planned: LMA  Additional Equipment:   Intra-op Plan:   Post-operative Plan:   Informed Consent: I have reviewed the patients History and Physical, chart, labs and discussed the procedure including the risks, benefits and alternatives for the proposed anesthesia with the patient or authorized representative who has indicated his/her understanding and acceptance.   Dental Advisory Given  Plan Discussed with: CRNA and Surgeon  Anesthesia Plan Comments:         Anesthesia Quick Evaluation

## 2017-01-08 NOTE — Anesthesia Procedure Notes (Signed)
Procedure Name: LMA Insertion Date/Time: 01/08/2017 1:05 PM Performed by: Vernice Jefferson Pre-anesthesia Checklist: Patient identified, Emergency Drugs available, Suction available and Patient being monitored Patient Re-evaluated:Patient Re-evaluated prior to inductionOxygen Delivery Method: Circle system utilized Preoxygenation: Pre-oxygenation with 100% oxygen Intubation Type: IV induction LMA: LMA inserted LMA Size: 4.0 Grade View: Grade II Tube type: Oral Number of attempts: 1 Placement Confirmation: positive ETCO2 and breath sounds checked- equal and bilateral Dental Injury: Teeth and Oropharynx as per pre-operative assessment

## 2017-01-08 NOTE — Transfer of Care (Signed)
Immediate Anesthesia Transfer of Care Note  Patient: Sharon Page  Procedure(s) Performed: Procedure(s): DILATATION & CURETTAGE/HYSTEROSCOPY WITH MYOSURE (N/A)  Patient Location: PACU  Anesthesia Type:General  Level of Consciousness: awake, alert  and oriented  Airway & Oxygen Therapy: Patient Spontanous Breathing  Post-op Assessment: Report given to RN and Post -op Vital signs reviewed and stable  Post vital signs: Reviewed and stable  Last Vitals:  Vitals:   01/08/17 0941 01/08/17 1346  BP: (!) 135/96 131/80  Pulse: 64 73  Resp: 18 (P) 18  Temp: 36.5 C (P) 36.8 C    Last Pain:  Vitals:   01/08/17 0941  TempSrc: Oral      Patients Stated Pain Goal: 5 (28/40/69 8614)  Complications: No apparent anesthesia complications

## 2017-01-08 NOTE — Discharge Instructions (Addendum)
Post-surgical Instructions, Outpatient Surgery  You may expect to feel dizzy, weak, and drowsy for as long as 24 hours after receiving the medicine that made you sleep (anesthetic). For the first 24 hours after your surgery:    Do not drive a car, ride a bicycle, participate in physical activities, or take public transportation until you are done taking narcotic pain medicines or as directed by Dr. Sabra Heck.   Do not drink alcohol or take tranquilizers.   Do not take medicine that has not been prescribed by your physicians.   Do not sign important papers or make important decisions while on narcotic pain medicines.   Have a responsible person with you.   PAIN MANAGEMENT  Motrin 800mg .  (This is the same as 4-200mg  over the counter tablets of Motrin or ibuprofen.)  You may take this every eight hours or as needed for cramping.    Tramadol 50mg .  1-2 tabs every 6 hours as needed for pain.    DO'S AND DON'T'S  Do not take a tub bath for one week.  You may shower on the first day after your surgery  Do not do any heavy lifting for one to two weeks.  This increases the chance of bleeding.  Do move around as you feel able.  Stairs are fine.  You may begin to exercise again as you feel able.  Do not lift any weights for two weeks.  Do not put anything in the vagina for two weeks--no tampons, intercourse, or douching.    REGULAR MEDIATIONS/VITAMINS:  You may restart all of your regular medications as prescribed.  You may restart all of your vitamins as you normally take them.    PLEASE CALL OR SEEK MEDICAL CARE IF:  You have persistent nausea and vomiting.   You have trouble eating or drinking.   You have an oral temperature above 100.5.   You have constipation that is not helped by adjusting diet or increasing fluid intake. Pain medicines are a common cause of constipation.   You have heavy vaginal bleeding

## 2017-01-08 NOTE — H&P (Signed)
Sharon Page is an 56 y.o. female G81P3 MWF here for hysteroscopy and polyp resection with D&C due to PMP bleeding and endometrial polyp.  Procedure, risks and benefits have been discussed.  Pt has hx of PE so Lovenox was given today.    Pertinent Gynecological History: Menses: post-menopausal Bleeding: post menopausal bleeding Contraception: PMP DES exposure: denies Blood transfusions: none Sexually transmitted diseases: no past history Previous GYN Procedures: none  Last mammogram: normal Date: 10/17 Last pap: normal Date: 2/17 with neg HR HPV OB History: G2, P3   Menstrual History: Patient's last menstrual period was 12/28/2010.    Past Medical History:  Diagnosis Date  . Environmental allergies   . GERD (gastroesophageal reflux disease)    diet controlled -tx tums  . History of hiatal hernia    no problems  . History of kidney stones    passed stone - no surgery required  . Hx gestational diabetes   . Nephrolithiasis 2001  . Pain in Achilles tendon    right  . Pulmonary embolism ([place]) 01/21/2011    Past Surgical History:  Procedure Laterality Date  . CESAREAN SECTION     x 2  . CHOLECYSTECTOMY    . COLONOSCOPY    . EYE SURGERY Bilateral    as a child  . TONSILLECTOMY    . WISDOM TOOTH EXTRACTION      Family History  Problem Relation Age of Onset  . Diabetes Father   . Hypertension Father   . Deep vein thrombosis Father   . Lung cancer Mother        lymphoma  . Tuberculosis Mother        as a child  . Skin cancer Maternal Grandmother   . Colon cancer Paternal Aunt        x2  . Heart attack Brother        half brother    Social History:  reports that she has never smoked. She has never used smokeless tobacco. She reports that she drinks about 4.2 oz of alcohol per week . She reports that she does not use drugs.  Allergies:  Allergies  Allergen Reactions  . Ciprofloxacin Hcl Other (See Comments)    Agitation   . Codeine Other (See Comments)    agitation  . Dilaudid [Hydromorphone Hcl] Other (See Comments)    Agitation   . Morphine And Related Other (See Comments)    Agitation     Prescriptions Prior to Admission  Medication Sig Dispense Refill Last Dose  . Ascorbic Acid (VITAMIN C) 1000 MG tablet Take 1,000 mg by mouth daily.   Past Month at Unknown time  . B Complex-C (SUPER B COMPLEX PO) Take 1 capsule by mouth daily.   Past Month at Unknown time  . calcium carbonate (TUMS - DOSED IN MG ELEMENTAL CALCIUM) 500 MG chewable tablet Chew 3 tablets by mouth daily as needed for indigestion or heartburn.   Past Week at Unknown time  . Cholecalciferol (VITAMIN D3) 2000 units TABS Take 2,000 Int'l Units by mouth daily.   Past Month at Unknown time  . EVENING PRIMROSE OIL PO Take 1,300 mg by mouth daily.    Past Month at Unknown time  . Ginkgo Biloba 120 MG TABS Take 120 mg by mouth daily.   Past Month at Unknown time  . MAGNESIUM CITRATE PO Take 400 mg by mouth at bedtime.   Past Month at Unknown time  . Omega 3-6-9 Fatty Acids (OMEGA-3-6-9 PO) Take  1,600 mg by mouth daily.   Past Month at Unknown time  . predniSONE (DELTASONE) 20 MG tablet Take 20-60 mg by mouth See admin instructions. Take 1 tablet 3x's daily x 3 days, then 1 tablet twice daily x 3 days & 1 tablet daily for 3 days.   Past Week at Unknown time  . Probiotic Product (PROBIOTIC PO) Take 1 capsule by mouth daily.   Past Month at Unknown time  . NONFORMULARY OR COMPOUNDED ITEM Vit E vaginal suppositories.  200U/ml.  One PV three times weekly. 36 each 3     Review of Systems  All other systems reviewed and are negative.   Blood pressure (!) 135/96, pulse 64, temperature 97.7 F (36.5 C), temperature source Oral, resp. rate 18, last menstrual period 12/28/2010, SpO2 99 %. Physical Exam  Constitutional: She is oriented to person, place, and time. She appears well-developed and well-nourished.  Cardiovascular: Normal rate and regular rhythm.   Respiratory: Effort normal  and breath sounds normal.  GI: Soft. Bowel sounds are normal.  Neurological: She is alert and oriented to person, place, and time.  Psychiatric: She has a normal mood and affect.    No results found for this or any previous visit (from the past 24 hour(s)).  No results found.  Assessment/Plan: 56 yo G2P3 MWF with endometrial polyp and PMP bleeding here for hysteroscopic removal and D&C.  Procedure reviewed.  Questions answered.  Pt ready to proceed.  Hale Bogus [facility] 01/08/2017, 11:38 AM

## 2017-01-08 NOTE — Anesthesia Postprocedure Evaluation (Signed)
Anesthesia Post Note  Patient: Sharon Page  Procedure(s) Performed: Procedure(s) (LRB): DILATATION & CURETTAGE/HYSTEROSCOPY WITH MYOSURE (N/A)     Patient location during evaluation: PACU Anesthesia Type: General Level of consciousness: awake Pain management: pain level controlled Vital Signs Assessment: post-procedure vital signs reviewed and stable Respiratory status: spontaneous breathing Cardiovascular status: stable Postop Assessment: no signs of nausea or vomiting Anesthetic complications: no    Last Vitals:  Vitals:   01/08/17 1346 01/08/17 1400  BP: 131/80 139/84  Pulse: 73 86  Resp: 18 16  Temp: 36.8 C     Last Pain:  Vitals:   01/08/17 0941  TempSrc: Oral   Pain Goal: Patients Stated Pain Goal: 5 (01/08/17 0941)               Jenalee Trevizo JR,JOHN Mateo Flow

## 2017-01-08 NOTE — Op Note (Signed)
01/08/2017  1:39 PM  PATIENT:  Sharon Page  56 y.o. female  PRE-OPERATIVE DIAGNOSIS:  PMB, endometrial polyp, h/o PE  POST-OPERATIVE DIAGNOSIS:  PMB, endometrial polyp, h/o PE  PROCEDURE:  Procedure(s): DILATATION & CURETTAGE/HYSTEROSCOPY WITH MYOSURE  SURGEON:  Emett Stapel SUZANNE  ASSISTANTS: OR staff   ANESTHESIA:   general, LMA  ESTIMATED BLOOD LOSS: 10cc  BLOOD ADMINISTERED:none   FLUIDS: 1200cc LR  UOP: 150cc clear UOP drained with I&O cath at beginning of procedure  SPECIMEN:  Endometrial polyp, endometrial curettings  DISPOSITION OF SPECIMEN:  PATHOLOGY  FINDINGS: endometrial cavity filled with polyps, thin endometrium  DESCRIPTION OF OPERATION: Patient was taken to the operating room.  She is placed in the supine position. SCDs were on her lower extremities and functioning properly. General anesthesia with an LMA was administered without difficulty.   Legs were then placed in the [place] in the low lithotomy position. The legs were lifted to the high lithotomy position and the Betadine prep was used on the inner thighs perineum and vagina x3. Patient was draped in a normal standard fashion. An in and out catheterization with a red rubber Foley catheter was performed.  A bivalve speculum was placed the vagina. The anterior lip of the cervix was grasped with single-tooth tenaculum.  A paracervical block of 1% lidocaine mixed one-to-one with epinephrine (1:100,000 units).  10 cc was used total. The cervix is dilated up to #21 [facility] dilators. The endometrial cavity sounded to 8 cm.   A myosure diagnostic hysteroscope was obtained. NS was used as a hysteroscopic fluid. The hysteroscope was advanced through the endocervical canal into the endometrial cavity. The cavity was filled with endometrial polyps.  At least three were noted.  The tubal ostia could not be seen due to amount of polyp tissue.  The myosure lite device was passed into the hysteroscope.  The  polyps were fully resected.  Then the cavity could be fully seen.  Tubal ostia were clearly seen at this point and noted bilaterally.  The hysteroscope was removed. A #1 toothed curette was used to curette the cavity until rough gritty texture was noted in all quadrants. The fluid deficit was 355 cc. The tenaculum was removed from the anterior lip of the cervix. The speculum was removed from the vagina. The prep was cleansed of the patient's skin. The legs are positioned back in the supine position. Sponge, lap, needle, initially counts were correct x2. Patient was taken to recovery in stable condition.  COUNTS:  YES  PLAN OF CARE: Transfer to PACU

## 2017-01-09 ENCOUNTER — Encounter (HOSPITAL_COMMUNITY): Payer: Self-pay | Admitting: Obstetrics & Gynecology

## 2017-01-11 ENCOUNTER — Telehealth: Payer: Self-pay | Admitting: *Deleted

## 2017-01-11 NOTE — Telephone Encounter (Signed)
-----   Message from Megan Salon, MD sent at 01/10/2017  1:15 PM EDT ----- Please let pt know her pathology was benign and showed endometrial polyps and normal endometrial tissue.  We will review this when she returns for her post op visit as well.  Thanks.

## 2017-01-11 NOTE — Telephone Encounter (Signed)
Spoke with patient. Results given as seen below from [place]. Patient is agreeable and verbalizes understanding.  Routing to provider for final review. Patient agreeable to disposition. Will close encounter.

## 2017-01-11 NOTE — Telephone Encounter (Signed)
Left message to call Nas Wafer at [phone].  

## 2017-01-11 NOTE — Telephone Encounter (Signed)
Patient returned call

## 2017-01-22 ENCOUNTER — Encounter: Payer: Self-pay | Admitting: Obstetrics & Gynecology

## 2017-01-22 ENCOUNTER — Ambulatory Visit (INDEPENDENT_AMBULATORY_CARE_PROVIDER_SITE_OTHER): Payer: 59 | Admitting: Obstetrics & Gynecology

## 2017-01-22 VITALS — BP 127/79 | HR 62 | Ht 60.75 in

## 2017-01-22 DIAGNOSIS — N840 Polyp of corpus uteri: Secondary | ICD-10-CM | POA: Diagnosis not present

## 2017-01-22 NOTE — Progress Notes (Signed)
Post Operative Visit  Procedure: DILATATION & CURETTAGE/HYSTEROSCOPY WITH MYOSURE Days Post-op: 14  Subjective: Doing well.  No vaginal bleeding.  Took nothing for pain post operatively.  Voiding and having regular bowel movements.  Pathology and pictures reviewed.  Objective: BP 127/79 (BP Location: Right Arm, Patient Position: Sitting, Cuff Size: Large)   Pulse 62   Ht 5' 0.75" (1.543 m)   LMP 12/28/2010   EXAM General: alert and cooperative Resp: clear to auscultation bilaterally Cardio: regular rate and rhythm, S1, S2 normal, no murmur, click, rub or gallop GI: soft, non-tender; bowel sounds normal; no masses,  no organomegaly Extremities: extremities normal, atraumatic, no cyanosis or edema Vaginal Bleeding: none  Assessment: s/p hysteroscopic polyp resection, D&C  Plan: Return for AEX 1 year.  Pt knows to call with any new bleeding.  She has established care with PCP and has a new patient appointment next week.

## 2017-01-23 ENCOUNTER — Encounter: Payer: Self-pay | Admitting: Physician Assistant

## 2017-01-23 ENCOUNTER — Ambulatory Visit (INDEPENDENT_AMBULATORY_CARE_PROVIDER_SITE_OTHER): Payer: 59 | Admitting: Physician Assistant

## 2017-01-23 VITALS — BP 130/88 | HR 78 | Temp 97.7°F | Resp 14 | Ht 61.0 in | Wt 220.0 lb

## 2017-01-23 DIAGNOSIS — M7661 Achilles tendinitis, right leg: Secondary | ICD-10-CM

## 2017-01-23 DIAGNOSIS — I517 Cardiomegaly: Secondary | ICD-10-CM | POA: Diagnosis not present

## 2017-01-23 NOTE — Assessment & Plan Note (Signed)
Noted on outside chest x-ray and CT angiography from June of this year. Performed at [facility]. Patient is completely asymptomatic. Blood pressure normotensive. Will obtain echocardiogram to further assess. No indication for pharmacotherapy at present.

## 2017-01-23 NOTE — Patient Instructions (Signed)
Please continue wearing supportive footwear. I am setting you up with Podiatry for further management of current symptoms as they have not responded to conservative measures.  I am also scheduling you for an echocardiogram to further assess enlarged heart noted on prior imaging. Your heart and lungs sound great today. I will call with results once I have them.  Please schedule an appointment at your earliest convenience for a complete physical.  It was a pleasure meeting you today. I look forward to working with you as your healthcare provider. Welcome to [facility]!

## 2017-01-23 NOTE — Assessment & Plan Note (Signed)
Not responding to conservative treatment. Some noted resistance on dorsiflexion of right foot. Referral placed to podiatry for further assessment and management.

## 2017-01-23 NOTE — Progress Notes (Signed)
Patient presents to clinic today to establish care. Diet -- Patient endorses well-balanced diet overall. Patient drinks mostly water and some coffee.  Exercise -- Patient is working on exercise regimen. Previously doing Zumba and walking daily.   Acute Concerns: Cardiomegaly -- Noted on prior CTA during ER visit this year. Patient denies any peripheral edema, PND or orthopnea. Patient denies chest pain, palpitations, lightheadedness, dizziness, vision changes or frequent headaches. Denies history of hypertension.   Patient also endorses recent diagnosis of achilles tendonitis in R heel by previous PCP. Has been taking OTC pain medications and received steroid injection without relief in symptoms.    Health Maintenance: Immunizations -- Colonoscopy -- Colonoscopy 2015 -- Normal per patient.  Mammogram -- October 2017. Normal per patient. Followed by GYN -- Dr. Sabra Heck PAP -- February 2017. Normal per patient. No history of recent abnormal PAP -- one isolated abnormal PAP 20 years prior.    Past Medical History:  Diagnosis Date  . Environmental allergies   . GERD (gastroesophageal reflux disease)    diet controlled -tx tums  . History of hiatal hernia    no problems  . History of kidney stones    passed stone - no surgery required  . Hx gestational diabetes   . Nephrolithiasis 2001  . Pain in Achilles tendon    right  . Pulmonary embolism ([place]) 01/21/2011    Past Surgical History:  Procedure Laterality Date  . CESAREAN SECTION     x 2  . CHOLECYSTECTOMY    . COLONOSCOPY    . DILATATION & CURETTAGE/HYSTEROSCOPY WITH MYOSURE N/A 01/08/2017   Procedure: [place];  Surgeon: Megan Salon, MD;  Location: [facility];  Service: Gynecology;  Laterality: N/A;  . EYE SURGERY Bilateral    as a child  . TONSILLECTOMY    . WISDOM TOOTH EXTRACTION      Current Outpatient Prescriptions on File Prior to Visit  Medication Sig Dispense Refill  .  Ascorbic Acid (VITAMIN C) 1000 MG tablet Take 1,000 mg by mouth daily.    . B Complex-C (SUPER B COMPLEX PO) Take 1 capsule by mouth daily.    . calcium carbonate (TUMS - DOSED IN MG ELEMENTAL CALCIUM) 500 MG chewable tablet Chew 3 tablets by mouth daily as needed for indigestion or heartburn.    . Cholecalciferol (VITAMIN D3) 2000 units TABS Take 2,000 Int'l Units by mouth daily.    Marland Kitchen EVENING PRIMROSE OIL PO Take 1,300 mg by mouth daily.     . Ginkgo Biloba 120 MG TABS Take 120 mg by mouth daily.    Marland Kitchen MAGNESIUM CITRATE PO Take 400 mg by mouth at bedtime.    . NONFORMULARY OR COMPOUNDED ITEM Vit E vaginal suppositories.  200U/ml.  One PV three times weekly. 36 each 3  . Omega 3-6-9 Fatty Acids (OMEGA-3-6-9 PO) Take 1,600 mg by mouth daily.    . Probiotic Product (PROBIOTIC PO) Take 1 capsule by mouth daily.    . vitamin B-12 (CYANOCOBALAMIN) 100 MCG tablet Take by mouth.     No current facility-administered medications on file prior to visit.     Allergies  Allergen Reactions  . Ciprofloxacin Hcl Other (See Comments)    Agitation   . Codeine Other (See Comments)    agitation  . Dilaudid [Hydromorphone Hcl] Other (See Comments)    Agitation   . Morphine And Related Other (See Comments)    Agitation     Family History  Problem  Relation Age of Onset  . Diabetes Father   . Hypertension Father   . Deep vein thrombosis Father   . Congestive Heart Failure Father   . Lung cancer Mother   . Tuberculosis Mother        as a child  . Lymphoma Mother   . Non-Hodgkin's lymphoma Mother   . Skin cancer Maternal Grandmother   . Colon cancer Paternal Aunt        x2  . Heart attack Brother        half brother - 81  . Healthy Daughter   . Crohn's disease Daughter   . Allergies Daughter     Social History   Social History  . Marital status: Married    Spouse name: Jenny Reichmann  . Number of children: 3  . Years of education: N/A   Occupational History  . Not on file.   Social History  Main Topics  . Smoking status: Never Smoker  . Smokeless tobacco: Never Used  . Alcohol use 4.2 oz/week    7 Glasses of wine per week  . Drug use: No  . Sexual activity: Yes    Partners: Male    Birth control/ protection: Post-menopausal   Other Topics Concern  . Not on file   Social History Narrative   Originally from [place] -- Moved here 16 years ago.    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   Review of Systems  Constitutional: Negative for fever and malaise/fatigue.  HENT: Negative for hearing loss.   Eyes: Negative for blurred vision and double vision.  Respiratory: Negative for cough and shortness of breath.   Cardiovascular: Negative for chest pain, palpitations and orthopnea.  Musculoskeletal:       + chronic heel pain  Neurological: Negative for dizziness, loss of consciousness and weakness.  Psychiatric/Behavioral: Negative for depression. The patient is not nervous/anxious and does not have insomnia.     BP 130/88   Pulse 78   Temp 97.7 F (36.5 C) (Oral)   Resp 14   Ht 5\' 1"  (1.549 m)   Wt 220 lb (99.8 kg)   LMP 12/28/2010   SpO2 98%   BMI 41.57 kg/m   Physical Exam  Constitutional: She is oriented to person, place, and time and well-developed, well-nourished, and in no distress.  HENT:  Head: Normocephalic and atraumatic.  Right Ear: External ear normal.  Left Ear: External ear normal.  Nose: Nose normal.  Mouth/Throat: Oropharynx is clear and moist. No oropharyngeal exudate.  TM within normal limits bilaterally.  Eyes: Conjunctivae are normal.  Neck: Neck supple.  Cardiovascular: Normal rate, regular rhythm, normal heart sounds and intact distal pulses.   Pulses:      Dorsalis pedis pulses are 2+ on the right side, and 2+ on the left side.       Posterior tibial pulses are 2+ on the right side, and 2+ on the left side.  Pulmonary/Chest: Effort normal and breath sounds normal. No respiratory distress. She has no  wheezes. She has no rales. She exhibits no tenderness.  Musculoskeletal:       Feet:  Neurological: She is alert and oriented to person, place, and time.  Skin: Skin is warm and dry. No rash noted.  Psychiatric: Affect normal.  Vitals reviewed.   Recent Results (from the past 5093 hour(s))  Follicle stimulating hormone     Status: None   Collection Time: 12/08/16  4:04 PM  Result Value Ref Range   FSH 84.8 mIU/mL    Comment:   Reference Range Female >=66 years of age: Follicular Phase 3.3-38.3 Mid-Cycle Peak   3.1-17.7 Luteal Phase     1.5-9.1 Postmenopausal   23.0-116.3     Hepatitis C antibody     Status: None   Collection Time: 12/08/16  4:04 PM  Result Value Ref Range   HCV Ab NEGATIVE NEGATIVE    Assessment/Plan: Tendonitis, Achilles, right Not responding to conservative treatment. Some noted resistance on dorsiflexion of right foot. Referral placed to podiatry for further assessment and management.  Cardiomegaly Noted on outside chest x-ray and CT angiography from June of this year. Performed at [facility]. Patient is completely asymptomatic. Blood pressure normotensive. Will obtain echocardiogram to further assess. No indication for pharmacotherapy at present.    Leeanne Rio, PA-C

## 2017-01-23 NOTE — Progress Notes (Signed)
Pre visit review using our clinic review tool, if applicable. No additional management support is needed unless otherwise documented below in the visit note. 

## 2017-01-25 ENCOUNTER — Encounter: Payer: Self-pay | Admitting: Obstetrics & Gynecology

## 2017-02-07 ENCOUNTER — Other Ambulatory Visit: Payer: Self-pay

## 2017-02-07 ENCOUNTER — Ambulatory Visit (HOSPITAL_COMMUNITY): Payer: 59 | Attending: Physician Assistant

## 2017-02-07 DIAGNOSIS — I517 Cardiomegaly: Secondary | ICD-10-CM

## 2017-02-07 HISTORY — PX: TRANSTHORACIC ECHOCARDIOGRAM: SHX275

## 2017-02-08 ENCOUNTER — Encounter: Payer: Self-pay | Admitting: Podiatry

## 2017-02-08 ENCOUNTER — Ambulatory Visit (INDEPENDENT_AMBULATORY_CARE_PROVIDER_SITE_OTHER): Payer: 59 | Admitting: Podiatry

## 2017-02-08 ENCOUNTER — Ambulatory Visit (INDEPENDENT_AMBULATORY_CARE_PROVIDER_SITE_OTHER): Payer: 59

## 2017-02-08 DIAGNOSIS — M7661 Achilles tendinitis, right leg: Secondary | ICD-10-CM

## 2017-02-08 MED ORDER — DICLOFENAC SODIUM 1 % TD GEL: 4.0000 g | Freq: Four times a day (QID) | TRANSDERMAL | 2 refills | Status: DC

## 2017-02-08 NOTE — Progress Notes (Signed)
   Subjective:    Patient ID: Sharon Page, female    DOB: 1961-05-08, 56 y.o.   MRN: [id number]  HPI: She presents today with chief complaint of posterior pain to her right heel. She states has been about 4 months she started [place] class started hurting she states that she laid off of the exercise regimen and started taking anti-inflammatories and she cannot take anti-inflammatories because of her hiatal hernia she noticed that elevated heels seemed to make the posterior heel of her right leg better.    Review of Systems  All other systems reviewed and are negative.      Objective:   Physical Exam: Vital signs are stable alert and oriented 3. Pulses are palpable. Neurologic sensorium is intact. Deep tendon reflexes are intact. Muscle strength is 5 over 5 dorsiflexion plantar flexors and inverters and everters all digits of musculature is intact. Orthopedic evaluation demonstrates all joints distal to the ankle range of motion without crepitation. Retrocalcaneal heel spurs noted on palpation as well as thickness of the Achilles tendon and overlying bursa. Radiographs taken today do demonstrate a large ridge of calcaneal heel spur thickening of the Achilles tendon near its insertion site. It appears to be intact. No open lesions or wounds.        Assessment & Plan:  Insertional Achilles tendinitis with retrocalcaneal heel spur right.  Plan: I injected the bursa today with dexamethasone and local anesthetic placed her into a plantar fascia brace and a night splint. Discussed appropriate shoe gear stretching her size and ice therapy follow-up with her in 1 month no anti-inflammatories were provided other than a topical diclofenac gel.

## 2017-02-08 NOTE — Patient Instructions (Signed)

## 2017-03-08 ENCOUNTER — Emergency Department (HOSPITAL_BASED_OUTPATIENT_CLINIC_OR_DEPARTMENT_OTHER)
Admission: EM | Admit: 2017-03-08 | Discharge: 2017-03-08 | Disposition: A | Discharge status: Home / Self Care | Payer: 59 | Attending: Physician Assistant | Admitting: Physician Assistant

## 2017-03-08 ENCOUNTER — Emergency Department (HOSPITAL_BASED_OUTPATIENT_CLINIC_OR_DEPARTMENT_OTHER): Payer: 59

## 2017-03-08 ENCOUNTER — Encounter (HOSPITAL_BASED_OUTPATIENT_CLINIC_OR_DEPARTMENT_OTHER): Payer: Self-pay

## 2017-03-08 DIAGNOSIS — R1031 Right lower quadrant pain: Secondary | ICD-10-CM | POA: Diagnosis present

## 2017-03-08 DIAGNOSIS — N132 Hydronephrosis with renal and ureteral calculous obstruction: Secondary | ICD-10-CM | POA: Diagnosis not present

## 2017-03-08 DIAGNOSIS — N200 Calculus of kidney: Secondary | ICD-10-CM | POA: Insufficient documentation

## 2017-03-08 DIAGNOSIS — Z79899 Other long term (current) drug therapy: Secondary | ICD-10-CM | POA: Insufficient documentation

## 2017-03-08 LAB — CBC
HCT: 41.3 % (ref 36.0–46.0)
Hemoglobin: 14.1 g/dL (ref 12.0–15.0)
MCH: 29.5 pg (ref 26.0–34.0)
MCHC: 34.1 g/dL (ref 30.0–36.0)
MCV: 86.4 fL (ref 78.0–100.0)
PLATELETS: 217 10*3/uL (ref 150–400)
RBC: 4.78 MIL/uL (ref 3.87–5.11)
RDW: 13.5 % (ref 11.5–15.5)
WBC: 7.6 10*3/uL (ref 4.0–10.5)

## 2017-03-08 LAB — COMPREHENSIVE METABOLIC PANEL
ALT: 16 U/L (ref 14–54)
AST: 20 U/L (ref 15–41)
Albumin: 4.5 g/dL (ref 3.5–5.0)
Alkaline Phosphatase: 68 U/L (ref 38–126)
Anion gap: 11 (ref 5–15)
BILIRUBIN TOTAL: 0.6 mg/dL (ref 0.3–1.2)
BUN: 15 mg/dL (ref 6–20)
CHLORIDE: 103 mmol/L (ref 101–111)
CO2: 26 mmol/L (ref 22–32)
CREATININE: 1.07 mg/dL — AB (ref 0.44–1.00)
Calcium: 9.6 mg/dL (ref 8.9–10.3)
GFR calc Af Amer: >60 mL/min (ref >60)
GFR, EST NON AFRICAN AMERICAN: 58 mL/min — AB (ref >60)
GLUCOSE: 127 mg/dL — AB (ref 65–99)
Potassium: 3.4 mmol/L — ABNORMAL LOW (ref 3.5–5.1)
Sodium: 140 mmol/L (ref 135–145)
TOTAL PROTEIN: 7.4 g/dL (ref 6.5–8.1)

## 2017-03-08 LAB — URINALYSIS, ROUTINE W REFLEX MICROSCOPIC
BILIRUBIN URINE: NEGATIVE
GLUCOSE, UA: NEGATIVE mg/dL
KETONES UR: 15 mg/dL — AB
Nitrite: NEGATIVE
PROTEIN: 100 mg/dL — AB
Specific Gravity, Urine: 1.022 (ref 1.005–1.030)
pH: 5.5 (ref 5.0–8.0)

## 2017-03-08 LAB — URINALYSIS, MICROSCOPIC (REFLEX)

## 2017-03-08 LAB — LIPASE, BLOOD: Lipase: 33 U/L (ref 11–51)

## 2017-03-08 MED ORDER — KETOROLAC TROMETHAMINE 30 MG/ML IJ SOLN
30.0000 mg | Freq: Once | INTRAMUSCULAR | Status: AC
  Administered 2017-03-08: 30 mg via INTRAVENOUS

## 2017-03-08 MED ORDER — ONDANSETRON 4 MG PO TBDP: 4.0000 mg | ORAL_TABLET | Freq: Three times a day (TID) | ORAL | 0 refills | Status: DC | PRN

## 2017-03-08 MED ORDER — ONDANSETRON HCL 4 MG/2ML IJ SOLN
4.0000 mg | Freq: Once | INTRAMUSCULAR | Status: AC
  Administered 2017-03-08: 4 mg via INTRAVENOUS

## 2017-03-08 MED ORDER — OXYCODONE-ACETAMINOPHEN 5-325 MG PO TABS: 1.0000 | ORAL_TABLET | Freq: Four times a day (QID) | ORAL | 0 refills | Status: DC | PRN

## 2017-03-08 MED ORDER — OMEPRAZOLE 20 MG PO CPDR: 20.0000 mg | DELAYED_RELEASE_CAPSULE | Freq: Every day | ORAL | 0 refills | Status: DC

## 2017-03-08 MED ORDER — ONDANSETRON HCL 4 MG/2ML IJ SOLN
INTRAMUSCULAR | Status: DC
Start: 2017-03-08 — End: 2017-03-09
  Filled 2017-03-08: qty 2

## 2017-03-08 MED ORDER — TAMSULOSIN HCL 0.4 MG PO CAPS: 0.4000 mg | ORAL_CAPSULE | Freq: Every day | ORAL | 0 refills | Status: DC

## 2017-03-08 MED ORDER — KETOROLAC TROMETHAMINE 30 MG/ML IJ SOLN
INTRAMUSCULAR | Status: AC
  Filled 2017-03-08: qty 1

## 2017-03-08 MED ORDER — KETOROLAC TROMETHAMINE 10 MG PO TABS: 10.0000 mg | ORAL_TABLET | Freq: Four times a day (QID) | ORAL | 0 refills | Status: DC | PRN

## 2017-03-08 NOTE — ED Provider Notes (Signed)
[facility] Provider Note   CSN: [id number] Arrival date & time: 03/08/17  1942     History   Chief Complaint Chief Complaint  Patient presents with  . Abdominal Pain    HPI Sharon Page is a 56 y.o. female.  HPI   Patient is a 56 year old female presenting with acute onset of pain in her abdominal radiating to groin. Patient reports she was feeling normal and she had dinner and then all sudden developed pain. Patient reports it feels worse than labor.  No fevers. No diarrhea. Patient's is nauseated and vomited once due to pain. Past surgical history is 2 C-sections and a gallbladder removal.  Past Medical History:  Diagnosis Date  . Environmental allergies   . GERD (gastroesophageal reflux disease)    diet controlled -tx tums  . History of hiatal hernia    no problems  . History of kidney stones    passed stone - no surgery required  . Nephrolithiasis 2001  . Pain in Achilles tendon    right  . Pulmonary embolism ([place]) 01/21/2011    Patient Active Problem List   Diagnosis Date Noted  . Tendonitis, Achilles, right 01/23/2017  . Cardiomegaly 01/23/2017  . Healed or old pulmonary embolism 10/31/2013  . Family history of colon cancer 10/31/2013    Past Surgical History:  Procedure Laterality Date  . CESAREAN SECTION     x 2  . CHOLECYSTECTOMY    . COLONOSCOPY    . DILATATION & CURETTAGE/HYSTEROSCOPY WITH MYOSURE N/A 01/08/2017   Procedure: [place];  Surgeon: Megan Salon, MD;  Location: [facility];  Service: Gynecology;  Laterality: N/A;  . EYE SURGERY Bilateral    as a child  . TONSILLECTOMY    . WISDOM TOOTH EXTRACTION      OB History    Gravida Para Term Preterm AB Living   2 2 2     3    SAB TAB Ectopic Multiple Live Births         1         Home Medications    Prior to Admission medications   Medication Sig Start Date End Date Taking? Authorizing Provider  Ascorbic Acid (VITAMIN C) 1000 MG  tablet Take 1,000 mg by mouth daily.    [provider]  B Complex-C (SUPER B COMPLEX PO) Take 1 capsule by mouth daily.    [provider]  calcium carbonate (TUMS - DOSED IN MG ELEMENTAL CALCIUM) 500 MG chewable tablet Chew 3 tablets by mouth daily as needed for indigestion or heartburn.    [provider]  Cholecalciferol (VITAMIN D3) 2000 units TABS Take 2,000 Int'l Units by mouth daily.    [provider]  diclofenac sodium (VOLTAREN) 1 % GEL Apply 4 g topically 4 (four) times daily. 02/08/17   Hyatt, Max T, DPM  EVENING PRIMROSE OIL PO Take 1,300 mg by mouth daily.     [provider]  Ginkgo Biloba 120 MG TABS Take 120 mg by mouth daily.    [provider]  MAGNESIUM CITRATE PO Take 400 mg by mouth at bedtime.    [provider]  NONFORMULARY OR COMPOUNDED ITEM Vit E vaginal suppositories.  200U/ml.  One PV three times weekly. 12/08/16   Megan Salon, MD  Omega 3-6-9 Fatty Acids (OMEGA-3-6-9 PO) Take 1,600 mg by mouth daily.    [provider]  Probiotic Product (PROBIOTIC PO) Take 1 capsule by mouth daily.  [provider]  vitamin B-12 (CYANOCOBALAMIN) 100 MCG tablet Take by mouth.    [provider]    Family History Family History  Problem Relation Age of Onset  . Diabetes Father   . Hypertension Father   . Deep vein thrombosis Father   . Congestive Heart Failure Father   . Lung cancer Mother   . Tuberculosis Mother        as a child  . Lymphoma Mother   . Non-Hodgkin's lymphoma Mother   . Skin cancer Maternal Grandmother   . Colon cancer Paternal Aunt        x2  . Heart attack Brother        half brother - 10  . Healthy Daughter   . Crohn's disease Daughter   . Allergies Daughter     Social History Social History  Substance Use Topics  . Smoking status: Never Smoker  . Smokeless tobacco: Never Used  . Alcohol use Yes     Comment: weekly     Allergies   Ciprofloxacin  hcl; Codeine; Dilaudid [hydromorphone hcl]; and Morphine and related   Review of Systems Review of Systems  Constitutional: Negative for activity change, fatigue and fever.  Respiratory: Negative for shortness of breath.   Cardiovascular: Negative for chest pain.  Gastrointestinal: Positive for abdominal pain, nausea and vomiting. Negative for blood in stool and diarrhea.  Genitourinary: Positive for hematuria.     Physical Exam Updated Vital Signs BP (!) 150/94 (BP Location: Left Arm)   Pulse 71   Temp 97.9 F (36.6 C) (Oral)   Resp 20   Ht 5\' 1"  (1.549 m)   Wt 102.3 kg (225 lb 8.5 oz)   LMP 12/28/2010   SpO2 100%   BMI 42.61 kg/m   Physical Exam  Constitutional: She is oriented to person, place, and time. She appears well-developed and well-nourished.  HENT:  Head: Normocephalic and atraumatic.  Eyes: Right eye exhibits no discharge.  Cardiovascular: Normal rate, regular rhythm and normal heart sounds.   No murmur heard. Pulmonary/Chest: Effort normal and breath sounds normal. She has no wheezes. She has no rales.  Abdominal: Soft. She exhibits no distension. There is tenderness.  Mild tenderness to deep aplaption.   Neurological: She is oriented to person, place, and time.  Skin: Skin is warm and dry. She is not diaphoretic.  Psychiatric: She has a normal mood and affect.  Nursing note and vitals reviewed.    ED Treatments / Results  Labs (all labs ordered are listed, but only abnormal results are displayed) Labs Reviewed  COMPREHENSIVE METABOLIC PANEL - Abnormal; Notable for the following:       Result Value   Potassium 3.4 (*)    Glucose, Bld 127 (*)    Creatinine, Ser 1.07 (*)    GFR calc non Af Amer 58 (*)    All other components within normal limits  URINALYSIS, ROUTINE W REFLEX MICROSCOPIC - Abnormal; Notable for the following:    Color, Urine RED (*)    APPearance TURBID (*)    Hgb urine dipstick LARGE (*)    Ketones, ur 15 (*)    Protein, ur 100  (*)    Leukocytes, UA LARGE (*)    All other components within normal limits  URINALYSIS, MICROSCOPIC (REFLEX) - Abnormal; Notable for the following:    Bacteria, UA FEW (*)    Squamous Epithelial / LPF 0-5 (*)    All other components within normal limits  LIPASE,  BLOOD  CBC    EKG  EKG Interpretation None       Radiology No results found.  Procedures Procedures (including critical care time)  Medications Ordered in ED Medications  ondansetron (ZOFRAN) 4 MG/2ML injection (not administered)  ketorolac (TORADOL) 30 MG/ML injection (not administered)  ondansetron (ZOFRAN) injection 4 mg (4 mg Intravenous Given 03/08/17 2012)  ketorolac (TORADOL) 30 MG/ML injection 30 mg (30 mg Intravenous Given 03/08/17 2110)     Initial Impression / Assessment and Plan / ED Course  I have reviewed the triage vital signs and the nursing notes.  Pertinent labs & imaging results that were available during my care of the patient were reviewed by me and considered in my medical decision making (see chart for details).     Patient is a 56 year old presenting with right-sided abdominal pain. Was a very Acute nature which would be atypical for diverticulitis, gastritis, gastritis, or appendicitis. Given patient's abrupt onset and the blood in her urine, I'm most concerned about a kidney stone. We will do CT noncontrast given stone is highest on my differential.   10:50 PM  CT shows large stone. Patient able to make urine. Discussed with the urologist. No evidence of infection with no white count and no fever and no symptoms. We'll give Flomax, pain control. Patient's very sensitive to pain medications we will give per request. Jeannette Corpus follow up with urology.  Final Clinical Impressions(s) / ED Diagnoses   Final diagnoses:  None    New Prescriptions New Prescriptions   No medications on file     Macarthur Critchley, MD 03/08/17 2251

## 2017-03-08 NOTE — ED Notes (Signed)
ED Provider at bedside. 

## 2017-03-08 NOTE — Discharge Instructions (Signed)
As we discussed please return if worsening pain, fever, unable to take by mouth.  You can take ketorolac for your pain. Be careful, it may hurt your stomach. Please use omeprazole to help coat her stomach. Use Zofran to help with nausea.  Please follow up with urology.

## 2017-03-08 NOTE — ED Notes (Signed)
Patient transported to CT 

## 2017-03-08 NOTE — ED Triage Notes (Signed)
C/o RLQ pain x today-NAD-steady gait

## 2017-03-10 LAB — URINE CULTURE

## 2017-03-14 DIAGNOSIS — N201 Calculus of ureter: Secondary | ICD-10-CM | POA: Diagnosis not present

## 2017-03-15 ENCOUNTER — Encounter (HOSPITAL_BASED_OUTPATIENT_CLINIC_OR_DEPARTMENT_OTHER): Payer: Self-pay | Admitting: *Deleted

## 2017-03-15 ENCOUNTER — Encounter: Payer: Self-pay | Admitting: Podiatry

## 2017-03-15 ENCOUNTER — Other Ambulatory Visit: Payer: Self-pay | Admitting: Urology

## 2017-03-15 ENCOUNTER — Ambulatory Visit (INDEPENDENT_AMBULATORY_CARE_PROVIDER_SITE_OTHER): Payer: 59 | Admitting: Podiatry

## 2017-03-15 DIAGNOSIS — M7661 Achilles tendinitis, right leg: Secondary | ICD-10-CM | POA: Diagnosis not present

## 2017-03-15 NOTE — Progress Notes (Signed)
NPO AFTER MN.  ARRIVE AT 0715.  CURRENT LAB RESULTS IN CHART AND EPIC.  WILL TAKE PRILOSEC AM DOS W/ SIPS OF WATER.

## 2017-03-15 NOTE — Progress Notes (Signed)
She presents to day for follow-up of her Achilles tendinitis. She states that she is about 80-85 and possibly 90% improved at this point she states that she has not been utilizing her diclofenac gel. She has been utilizing her night splint.  Objective: Vital signs are stable she's alert and oriented 3 pulses are palpable. Neurologic sensorium is intact. Deep tendon reflexes are intact. She has tenderness on palpation of the posterior inferior aspect of the Achilles tendon and posterior calcaneus.  Assessment: Insertional Achilles tendinitis resolving 80 day 5-90%.  Plan: Encouraged her to continue all conservative therapies but increase the use of the diclofenac gel. Follow up with her in 1 month necessary

## 2017-03-16 NOTE — H&P (Signed)
Urology Preoperative H&P   Chief Complain: Right flank pain  History of Present Illness: Sharon Page is a 56 y.o. with a one-week history of right-sided flank pain.  She had a CT abdomen/pelvis on 03/08/2017 that demonstrated a 6.8 mm right UPJ stone with mild hydronephrosis.  She denies fevers/chills, dysuria or gross hematuria.   Past Medical History:  Diagnosis Date  . Environmental allergies   . GERD (gastroesophageal reflux disease)    diet controlled -tx tums  . Hematuria   . Hiatal hernia   . History of gestational hypertension   . History of kidney stones   . History of pulmonary embolism 01/21/2011   bilateral -- RML, RLL, LLL  . Hydronephrosis, right   . OA (osteoarthritis)   . Pain in Achilles tendon    right  . Ureteropelvic junction calculus    right side  . Uterine fibroid   . Wears glasses     Past Surgical History:  Procedure Laterality Date  . [place]  . COLONOSCOPY  11/12/2013  . DILATATION & CURETTAGE/HYSTEROSCOPY WITH MYOSURE N/A 01/08/2017   Procedure: [place];  Surgeon: Megan Salon, MD;  Location: [facility];  Service: Gynecology;  Laterality: N/A;  . LAPAROSCOPIC CHOLECYSTECTOMY  1995  . STRABISMUS SURGERY Bilateral age 38  . TONSILLECTOMY  child  . TRANSTHORACIC ECHOCARDIOGRAM  02/07/2017   EF 55-60%  . WISDOM TOOTH EXTRACTION      Allergies:  Allergies  Allergen Reactions  . Ciprofloxacin Hcl Other (See Comments)    Agitation   . Codeine Other (See Comments)    agitation  . Dilaudid [Hydromorphone Hcl] Other (See Comments)    Agitation   . Morphine And Related Other (See Comments)    Agitation     Family History  Problem Relation Age of Onset  . Diabetes Father   . Hypertension Father   . Deep vein thrombosis Father   . Congestive Heart Failure Father   . Lung cancer Mother   . Tuberculosis Mother        as a child  . Lymphoma Mother   . Non-Hodgkin's lymphoma  Mother   . Skin cancer Maternal Grandmother   . Colon cancer Paternal Aunt        x2  . Heart attack Brother        half brother - 69  . Healthy Daughter   . Crohn's disease Daughter   . Allergies Daughter     Social History:  reports that she has never smoked. She has never used smokeless tobacco. She reports that she drinks alcohol. She reports that she does not use drugs.  ROS: A complete review of systems was performed.  All systems are negative except for pertinent findings as noted.  Physical Exam:  Vital signs in last 24 hours: Weight:  [102.1 kg (225 lb)] 102.1 kg (225 lb) (08/23 1658) Constitutional:  Alert and oriented, No acute distress Cardiovascular: Regular rate and rhythm, No JVD Respiratory: Normal respiratory effort, Lungs clear bilaterally GI: Abdomen is soft, nontender, nondistended, no abdominal masses GU: No CVA tenderness Lymphatic: No lymphadenopathy Neurologic: Grossly intact, no focal deficits Psychiatric: Normal mood and affect  Laboratory Data:  No results for input(s): WBC, HGB, HCT, PLT in the last 72 hours.  No results for input(s): NA, K, CL, GLUCOSE, BUN, CALCIUM, CREATININE in the last 72 hours.  Invalid input(s): CO3   No results found for this or any previous visit (  from the past 24 hour(s)). Recent Results (from the past 240 hour(s))  Urine culture     Status: Abnormal   Collection Time: 03/08/17 10:23 PM  Result Value Ref Range Status   Specimen Description URINE, CLEAN CATCH  Final   Special Requests NONE  Final   Culture MULTIPLE SPECIES PRESENT, SUGGEST RECOLLECTION (A)  Final   Report Status 03/10/2017 FINAL  Final    Renal Function: No results for input(s): CREATININE in the last 168 hours. Estimated Creatinine Clearance: 65.2 mL/min (A) (by C-G formula based on SCr of 1.07 mg/dL (H)).  Radiologic Imaging: No results found.  I independently reviewed the above imaging studies.  Assessment and Plan Sharon Page is a  56 y.o. female with 6.8 mm right UPJ calculus with mild hydronephrosis   -The risks, benefits and alternatives of cystoscopy with right ureteroscopy, holmium laser lithotripsy and right JJ stent placement was discussed a shunt.  She voices understanding and wishes to proceed.     Sharon Page 03/16/2017, 10:28 AM    Annitta Jersey, MD

## 2017-03-20 DIAGNOSIS — N201 Calculus of ureter: Secondary | ICD-10-CM | POA: Diagnosis not present

## 2017-03-21 ENCOUNTER — Ambulatory Visit (HOSPITAL_BASED_OUTPATIENT_CLINIC_OR_DEPARTMENT_OTHER): Payer: 59 | Admitting: Anesthesiology

## 2017-03-21 ENCOUNTER — Encounter (HOSPITAL_BASED_OUTPATIENT_CLINIC_OR_DEPARTMENT_OTHER)
Admission: RE | Disposition: A | Discharge status: Home / Self Care | Payer: Self-pay | Source: Ambulatory Visit | Attending: Urology

## 2017-03-21 ENCOUNTER — Other Ambulatory Visit: Payer: Self-pay | Admitting: Urology

## 2017-03-21 ENCOUNTER — Encounter (HOSPITAL_BASED_OUTPATIENT_CLINIC_OR_DEPARTMENT_OTHER): Payer: Self-pay | Admitting: *Deleted

## 2017-03-21 ENCOUNTER — Ambulatory Visit (HOSPITAL_BASED_OUTPATIENT_CLINIC_OR_DEPARTMENT_OTHER)
Admission: RE | Admit: 2017-03-21 | Discharge: 2017-03-21 | Disposition: A | Discharge status: Home / Self Care | Payer: 59 | Source: Ambulatory Visit | Attending: Urology | Admitting: Urology

## 2017-03-21 DIAGNOSIS — K449 Diaphragmatic hernia without obstruction or gangrene: Secondary | ICD-10-CM | POA: Diagnosis not present

## 2017-03-21 DIAGNOSIS — Z888 Allergy status to other drugs, medicaments and biological substances status: Secondary | ICD-10-CM | POA: Insufficient documentation

## 2017-03-21 DIAGNOSIS — Z8379 Family history of other diseases of the digestive system: Secondary | ICD-10-CM | POA: Diagnosis not present

## 2017-03-21 DIAGNOSIS — N201 Calculus of ureter: Secondary | ICD-10-CM | POA: Diagnosis not present

## 2017-03-21 DIAGNOSIS — Z833 Family history of diabetes mellitus: Secondary | ICD-10-CM | POA: Insufficient documentation

## 2017-03-21 DIAGNOSIS — I517 Cardiomegaly: Secondary | ICD-10-CM | POA: Diagnosis not present

## 2017-03-21 DIAGNOSIS — Z807 Family history of other malignant neoplasms of lymphoid, hematopoietic and related tissues: Secondary | ICD-10-CM | POA: Insufficient documentation

## 2017-03-21 DIAGNOSIS — Z8249 Family history of ischemic heart disease and other diseases of the circulatory system: Secondary | ICD-10-CM | POA: Insufficient documentation

## 2017-03-21 DIAGNOSIS — M1990 Unspecified osteoarthritis, unspecified site: Secondary | ICD-10-CM | POA: Diagnosis not present

## 2017-03-21 DIAGNOSIS — K219 Gastro-esophageal reflux disease without esophagitis: Secondary | ICD-10-CM | POA: Insufficient documentation

## 2017-03-21 DIAGNOSIS — Z808 Family history of malignant neoplasm of other organs or systems: Secondary | ICD-10-CM | POA: Diagnosis not present

## 2017-03-21 DIAGNOSIS — Z9049 Acquired absence of other specified parts of digestive tract: Secondary | ICD-10-CM | POA: Insufficient documentation

## 2017-03-21 DIAGNOSIS — Z87442 Personal history of urinary calculi: Secondary | ICD-10-CM | POA: Insufficient documentation

## 2017-03-21 DIAGNOSIS — Z800 Family history of malignant neoplasm of digestive organs: Secondary | ICD-10-CM | POA: Diagnosis not present

## 2017-03-21 DIAGNOSIS — N132 Hydronephrosis with renal and ureteral calculous obstruction: Secondary | ICD-10-CM | POA: Insufficient documentation

## 2017-03-21 DIAGNOSIS — Z885 Allergy status to narcotic agent status: Secondary | ICD-10-CM | POA: Insufficient documentation

## 2017-03-21 DIAGNOSIS — Z86711 Personal history of pulmonary embolism: Secondary | ICD-10-CM | POA: Insufficient documentation

## 2017-03-21 DIAGNOSIS — Z836 Family history of other diseases of the respiratory system: Secondary | ICD-10-CM | POA: Insufficient documentation

## 2017-03-21 DIAGNOSIS — Z6841 Body Mass Index (BMI) 40.0 and over, adult: Secondary | ICD-10-CM | POA: Diagnosis not present

## 2017-03-21 HISTORY — DX: Personal history of other complications of pregnancy, childbirth and the puerperium: Z87.59

## 2017-03-21 HISTORY — DX: Hematuria, unspecified: R31.9

## 2017-03-21 HISTORY — PX: CYSTOSCOPY/URETEROSCOPY/HOLMIUM LASER/STENT PLACEMENT: SHX6546

## 2017-03-21 HISTORY — DX: Presence of spectacles and contact lenses: Z97.3

## 2017-03-21 HISTORY — DX: Leiomyoma of uterus, unspecified: D25.9

## 2017-03-21 HISTORY — DX: Unspecified osteoarthritis, unspecified site: M19.90

## 2017-03-21 HISTORY — DX: Personal history of pulmonary embolism: Z86.711

## 2017-03-21 HISTORY — DX: Diaphragmatic hernia without obstruction or gangrene: K44.9

## 2017-03-21 SURGERY — CYSTOSCOPY/URETEROSCOPY/HOLMIUM LASER/STENT PLACEMENT
Anesthesia: General | Site: Ureter | Laterality: Right

## 2017-03-21 MED ORDER — LIDOCAINE 2% (20 MG/ML) 5 ML SYRINGE
INTRAMUSCULAR | Status: DC | PRN
  Administered 2017-03-21: 100 mg via INTRAVENOUS

## 2017-03-21 MED ORDER — OXYCODONE HCL 5 MG/5ML PO SOLN
5.0000 mg | Freq: Once | ORAL | Status: DC | PRN
  Filled 2017-03-21: qty 5

## 2017-03-21 MED ORDER — IOHEXOL 300 MG/ML  SOLN
INTRAMUSCULAR | Status: DC | PRN
  Administered 2017-03-21: 6 mL

## 2017-03-21 MED ORDER — DEXAMETHASONE SODIUM PHOSPHATE 4 MG/ML IJ SOLN
INTRAMUSCULAR | Status: DC | PRN
  Administered 2017-03-21: 10 mg via INTRAVENOUS

## 2017-03-21 MED ORDER — FENTANYL CITRATE (PF) 100 MCG/2ML IJ SOLN
INTRAMUSCULAR | Status: AC
  Filled 2017-03-21: qty 2

## 2017-03-21 MED ORDER — OXYCODONE-ACETAMINOPHEN 5-325 MG PO TABS: 1.0000 | ORAL_TABLET | Freq: Four times a day (QID) | ORAL | 0 refills | Status: DC | PRN

## 2017-03-21 MED ORDER — ACETAMINOPHEN 10 MG/ML IV SOLN
1000.0000 mg | Freq: Once | INTRAVENOUS | Status: DC | PRN
  Filled 2017-03-21: qty 100

## 2017-03-21 MED ORDER — FENTANYL CITRATE (PF) 100 MCG/2ML IJ SOLN
INTRAMUSCULAR | Status: DC | PRN
  Administered 2017-03-21 (×2): 50 ug via INTRAVENOUS

## 2017-03-21 MED ORDER — CEFAZOLIN SODIUM 1 G IJ SOLR
INTRAMUSCULAR | Status: AC
  Filled 2017-03-21: qty 10

## 2017-03-21 MED ORDER — SCOPOLAMINE 1 MG/3DAYS TD PT72
MEDICATED_PATCH | TRANSDERMAL | Status: AC
  Filled 2017-03-21: qty 1

## 2017-03-21 MED ORDER — CEFAZOLIN SODIUM-DEXTROSE 2-4 GM/100ML-% IV SOLN
INTRAVENOUS | Status: AC
  Filled 2017-03-21: qty 100

## 2017-03-21 MED ORDER — METOCLOPRAMIDE HCL 5 MG/ML IJ SOLN
INTRAMUSCULAR | Status: DC | PRN
  Administered 2017-03-21: 5 mg via INTRAVENOUS

## 2017-03-21 MED ORDER — MIDAZOLAM HCL 2 MG/2ML IJ SOLN
INTRAMUSCULAR | Status: AC
  Filled 2017-03-21: qty 2

## 2017-03-21 MED ORDER — DEXAMETHASONE SODIUM PHOSPHATE 10 MG/ML IJ SOLN
INTRAMUSCULAR | Status: AC
  Filled 2017-03-21: qty 1

## 2017-03-21 MED ORDER — MEPERIDINE HCL 25 MG/ML IJ SOLN
6.2500 mg | INTRAMUSCULAR | Status: DC | PRN
  Filled 2017-03-21: qty 1

## 2017-03-21 MED ORDER — SODIUM CHLORIDE 0.9 % IV SOLN
INTRAVENOUS | Status: DC
  Filled 2017-03-21: qty 1000

## 2017-03-21 MED ORDER — PROPOFOL 10 MG/ML IV BOLUS
INTRAVENOUS | Status: AC
  Filled 2017-03-21: qty 40

## 2017-03-21 MED ORDER — PROMETHAZINE HCL 25 MG/ML IJ SOLN
6.2500 mg | INTRAMUSCULAR | Status: DC | PRN
  Filled 2017-03-21: qty 1

## 2017-03-21 MED ORDER — OXYCODONE HCL 5 MG PO TABS
5.0000 mg | ORAL_TABLET | Freq: Once | ORAL | Status: DC | PRN
  Filled 2017-03-21: qty 1

## 2017-03-21 MED ORDER — SULFAMETHOXAZOLE-TRIMETHOPRIM 800-160 MG PO TABS: 1.0000 | ORAL_TABLET | Freq: Two times a day (BID) | ORAL | 0 refills | Status: DC

## 2017-03-21 MED ORDER — FENTANYL CITRATE (PF) 100 MCG/2ML IJ SOLN
25.0000 ug | INTRAMUSCULAR | Status: DC | PRN
  Filled 2017-03-21: qty 1

## 2017-03-21 MED ORDER — ONDANSETRON HCL 4 MG/2ML IJ SOLN
INTRAMUSCULAR | Status: DC | PRN
  Administered 2017-03-21: 4 mg via INTRAVENOUS

## 2017-03-21 MED ORDER — LIDOCAINE 2% (20 MG/ML) 5 ML SYRINGE
INTRAMUSCULAR | Status: AC
  Filled 2017-03-21: qty 5

## 2017-03-21 MED ORDER — SODIUM CHLORIDE 0.9 % IR SOLN
Status: DC | PRN
  Administered 2017-03-21: 1 via INTRAVESICAL

## 2017-03-21 MED ORDER — DEXTROSE 5 % IV SOLN
1.0000 g | Freq: Once | INTRAVENOUS | Status: DC
  Filled 2017-03-21: qty 10

## 2017-03-21 MED ORDER — LACTATED RINGERS IV SOLN
INTRAVENOUS | Status: DC
  Administered 2017-03-21: 08:00:00 via INTRAVENOUS
  Filled 2017-03-21: qty 1000

## 2017-03-21 MED ORDER — METOCLOPRAMIDE HCL 5 MG/ML IJ SOLN
INTRAMUSCULAR | Status: AC
  Filled 2017-03-21: qty 2

## 2017-03-21 MED ORDER — MIDAZOLAM HCL 5 MG/5ML IJ SOLN
INTRAMUSCULAR | Status: DC | PRN
  Administered 2017-03-21: 2 mg via INTRAVENOUS

## 2017-03-21 MED ORDER — SCOPOLAMINE 1 MG/3DAYS TD PT72
MEDICATED_PATCH | TRANSDERMAL | Status: DC | PRN
  Administered 2017-03-21: 1 via TRANSDERMAL

## 2017-03-21 MED ORDER — PROPOFOL 10 MG/ML IV BOLUS
INTRAVENOUS | Status: DC | PRN
  Administered 2017-03-21: 200 mg via INTRAVENOUS

## 2017-03-21 MED ORDER — PROPOFOL 500 MG/50ML IV EMUL
INTRAVENOUS | Status: AC
  Filled 2017-03-21: qty 100

## 2017-03-21 MED ORDER — CEFAZOLIN SODIUM-DEXTROSE 2-4 GM/100ML-% IV SOLN
2.0000 g | INTRAVENOUS | Status: AC
  Administered 2017-03-21: 2 g via INTRAVENOUS
  Filled 2017-03-21: qty 100

## 2017-03-21 MED ORDER — ONDANSETRON HCL 4 MG/2ML IJ SOLN
INTRAMUSCULAR | Status: AC
  Filled 2017-03-21: qty 2

## 2017-03-21 SURGICAL SUPPLY — 27 items
BAG DRAIN URO-CYSTO SKYTR STRL (DRAIN) ×2 IMPLANT
BAG DRN UROCATH (DRAIN) ×1
BASKET LASER NITINOL 1.9FR (BASKET) ×2 IMPLANT
BSKT STON RTRVL 120 1.9FR (BASKET) ×1
CATH INTERMIT  6FR 70CM (CATHETERS) ×1 IMPLANT
CATH URET DUAL LUMEN 6-10FR 50 (CATHETERS) ×1 IMPLANT
CLOTH BEACON ORANGE TIMEOUT ST (SAFETY) ×4 IMPLANT
FIBER LASER FLEXIVA 365 (UROLOGICAL SUPPLIES) IMPLANT
FIBER LASER TRAC TIP (UROLOGICAL SUPPLIES) IMPLANT
GLOVE BIO SURGEON STRL SZ7.5 (GLOVE) ×2 IMPLANT
GOWN STRL REUS W/TWL LRG LVL3 (GOWN DISPOSABLE) ×2 IMPLANT
GOWN STRL REUS W/TWL XL LVL3 (GOWN DISPOSABLE) IMPLANT
GUIDEWIRE ANG ZIPWIRE 038X150 (WIRE) ×3 IMPLANT
GUIDEWIRE STR DUAL SENSOR (WIRE) ×2 IMPLANT
INFUSOR MANOMETER BAG 3000ML (MISCELLANEOUS) ×2 IMPLANT
IV NS 1000ML (IV SOLUTION) ×2
IV NS 1000ML BAXH (IV SOLUTION) ×1 IMPLANT
IV NS IRRIG 3000ML ARTHROMATIC (IV SOLUTION) ×2 IMPLANT
KIT RM TURNOVER CYSTO AR (KITS) ×2 IMPLANT
MANIFOLD NEPTUNE II (INSTRUMENTS) ×1 IMPLANT
NS IRRIG 500ML POUR BTL (IV SOLUTION) ×2 IMPLANT
PACK CYSTO (CUSTOM PROCEDURE TRAY) ×2 IMPLANT
SHEATH ACCESS URETERAL 38CM (SHEATH) ×1 IMPLANT
STENT URET 6FRX24 CONTOUR (STENTS) ×1 IMPLANT
SYRINGE 10CC LL (SYRINGE) ×2 IMPLANT
TUBE CONNECTING 12X1/4 (SUCTIONS) ×1 IMPLANT
TUBE FEEDING 8FR 16IN STR KANG (MISCELLANEOUS) ×2 IMPLANT

## 2017-03-21 NOTE — Interval H&P Note (Signed)
History and Physical Interval Note:  03/21/2017 7:36 AM  Sharon Page  has presented today for surgery, with the diagnosis of RIGHT URETEROPELVIC JUNCTION STONE WITH HYDRONEPHROSIS  The various methods of treatment have been discussed with the patient and family. After consideration of risks, benefits and other options for treatment, the patient has consented to  Procedure(s): CYSTOSCOPY/ RETROGRADE/URETEROSCOPY/HOLMIUM LASER/STENT PLACEMENT (Right) as a surgical intervention .  The patient's history has been reviewed, patient examined, no change in status, stable for surgery.  I have reviewed the patient's chart and labs.  Questions were answered to the patient's satisfaction.     Conception Oms Winter

## 2017-03-21 NOTE — Anesthesia Procedure Notes (Signed)
Procedure Name: LMA Insertion Date/Time: 03/21/2017 8:45 AM Performed by: Nolon Nations Pre-anesthesia Checklist: Patient identified, Emergency Drugs available, Suction available and Patient being monitored Patient Re-evaluated:Patient Re-evaluated prior to induction Oxygen Delivery Method: Circle system utilized Preoxygenation: Pre-oxygenation with 100% oxygen Induction Type: IV induction Ventilation: Mask ventilation without difficulty LMA: LMA inserted LMA Size: 4.0 Number of attempts: 1 Airway Equipment and Method: Bite block Placement Confirmation: positive ETCO2 Tube secured with: Tape Dental Injury: Teeth and Oropharynx as per pre-operative assessment

## 2017-03-21 NOTE — Anesthesia Postprocedure Evaluation (Signed)
Anesthesia Post Note  Patient: Sharon Page  Procedure(s) Performed: Procedure(s) (LRB): CYSTOSCOPY/ RETROGRADE/URETEROSCOPY/STENT PLACEMENT (Right)     Patient location during evaluation: PACU Anesthesia Type: General Level of consciousness: sedated and patient cooperative Pain management: pain level controlled Vital Signs Assessment: post-procedure vital signs reviewed and stable Respiratory status: spontaneous breathing Cardiovascular status: stable Anesthetic complications: no    Last Vitals:  Vitals:   03/21/17 1000 03/21/17 1023  BP: 113/72 120/70  Pulse: 67 80  Resp: 15 16  Temp:  36.6 C  SpO2: 93% 96%    Last Pain:  Vitals:   03/21/17 0717  TempSrc: Oral                 Nolon Nations

## 2017-03-21 NOTE — Anesthesia Preprocedure Evaluation (Signed)
Anesthesia Evaluation  Patient identified by MRN, date of birth, ID band Patient awake    Reviewed: Allergy & Precautions, H&P , NPO status , Patient's Chart, lab work & pertinent test results  Airway Mallampati: II  TM Distance: >3 FB Neck ROM: full    Dental no notable dental hx. (+) Teeth Intact   Pulmonary neg pulmonary ROS,    Pulmonary exam normal breath sounds clear to auscultation       Cardiovascular negative cardio ROS Normal cardiovascular exam Rhythm:regular Rate:Normal     Neuro/Psych negative neurological ROS  negative psych ROS   GI/Hepatic Neg liver ROS, hiatal hernia, GERD  ,  Endo/Other  Morbid obesity  Renal/GU Renal disease     Musculoskeletal  (+) Arthritis ,   Abdominal (+) + obese,   Peds  Hematology negative hematology ROS (+)   Anesthesia Other Findings   Reproductive/Obstetrics negative OB ROS                             Anesthesia Physical  Anesthesia Plan  ASA: III  Anesthesia Plan: General   Post-op Pain Management:    Induction:   PONV Risk Score and Plan: 4 or greater and Ondansetron, Dexamethasone, Propofol, Midazolam and Scopolamine patch - Pre-op  Airway Management Planned: LMA  Additional Equipment:   Intra-op Plan:   Post-operative Plan:   Informed Consent: I have reviewed the patients History and Physical, chart, labs and discussed the procedure including the risks, benefits and alternatives for the proposed anesthesia with the patient or authorized representative who has indicated his/her understanding and acceptance.   Dental Advisory Given  Plan Discussed with: CRNA and Surgeon  Anesthesia Plan Comments:         Anesthesia Quick Evaluation

## 2017-03-21 NOTE — Transfer of Care (Signed)
  Last Vitals:  Vitals:   03/21/17 0717 03/21/17 0934  BP: (!) 141/80   Pulse: 72   Resp: 18 (P) 12  Temp: (!) 36.4 C (!) (P) 36.2 C  SpO2: 97% (P) 94%    Last Pain:  Vitals:   03/21/17 0717  TempSrc: Oral      Patients Stated Pain Goal: 8 (03/21/17 0753)  Immediate Anesthesia Transfer of Care Note  Patient: Sharon Page  Procedure(s) Performed: Procedure(s) (LRB): CYSTOSCOPY/ RETROGRADE/URETEROSCOPY/STENT PLACEMENT (Right)  Patient Location: PACU  Anesthesia Type: General  Level of Consciousness: awake, alert  and oriented  Airway & Oxygen Therapy: Patient Spontanous Breathing and Patient connected to nasal cannula oxygen  Post-op Assessment: Report given to PACU RN and Post -op Vital signs reviewed and stable  Post vital signs: Reviewed and stable  Complications: No apparent anesthesia complications

## 2017-03-21 NOTE — Op Note (Signed)
Preoperative diagnosis:  1. 6 mm right midureteral stone  Postoperative diagnosis: 1. 6 mm right midureteral stone  Procedure(s): 1. Cystoscopy with right retrograde pyelogram, right flexible ureteroscopy and right [place] stent placement  Surgeon: Ellison Hughs, MD  Anesthesia: Gen. LMA  Complications: none  EBL: less than 5 mL  Specimens: 1. none  Intraoperative findings: stenotic right distal ureter on retrograde pyelogram  Indication: Sharon Page is a 56 year old female who presented to the office with right-sided flank pain and cross-sectional imaging demonstrating an obstructing 6 mm right midureteral calculus.  Description of procedure:  After informed consent was obtained, the patient was brought to the operating room and general LMA anesthesia was administered. The patient was placed in the dorsolithotomy position and prepped and draped in usual sterile fashion. A timeout was performed. A 21 French rigid cystoscope was inserted into the urethral meatus and incomplete bladder survey revealed no intravesical pathology. A 5 French open-ended catheter was then inserted into the right ureteral orifice and a retrograde pyelogram was obtained that showed a filling defect consistent with a stone within her right mid ureter. No other filling defects within the right renal pelvis were identified on retrograde pyelogramA flexible tip Glidewire was then used to intubate the right ureteral orifice and was advanced up to the renal pelvis, under fluoroscopic guidance. The rigid cystoscope was then exchanged for a flexible ureteroscope.the flexible ureteroscope was then advanced alongside the Glidewire and into the distal aspects of the right ureter. I medially noticed an area of stenosis as the ureter crossed over the iliac vessels. We then attempted to dilate the ureter with a dual lumen catheter over the safety wire, but the catheter would not advance beyond the level of the iliac  vessels. I then reattempted to advance the ureteroscope past the area of stenosis, but was unsuccessful. The defects were ureteroscope was then removed under direct vision and a 6 [place] JJ stent was placed over the wire and into good position within the right collecting system, confirming placement by fluoroscopy. The patient's bladder was then completely drained. She tolerated the procedure well and was transferred to the postanesthesia in stable condition.  Plan: Repeat ureteroscopy and the next 7-10 days for definitive stone treatment

## 2017-03-21 NOTE — Discharge Instructions (Signed)
Your Recovery A ureteral stent is a thin, hollow tube that is placed in the ureter to help urine pass from the kidney into the bladder. Ureters are the tubes that connect the kidneys to the bladder.  You may have a small amount of blood in your urine for 1 to 3 days after the procedure.  While the stent is in place, you may have to urinate more often, feel a sudden need to urinate, or feel like you cannot completely empty your bladder. You may feel some pain when you urinate or do strenuous activity. You also may notice a small amount of blood in your urine after strenuous activities. These side effects usually do not prevent people from doing their normal daily activities.  You may have a thin string coming out of your urethra. Your urethra is the tube that carries urine from your bladder to outside your body. This string is attached to the stent. Try not to pull on the string. The doctor will use the string to pull out the stent when you no longer need it.  After the procedure, urine may flow better from your kidneys to your bladder. A ureteral stent may be left in place for several days or for as long as several months. Your doctor will take it out when you no longer need it.  This care sheet gives you a general idea about how long it will take for you to recover. But each person recovers at a different pace. Follow the steps below to get better as quickly as possible.  How can you care for yourself at home?   Activity    Rest when you feel tired. Getting enough sleep will help you recover. Avoid strenuous activities, such as bicycle riding, jogging, weight lifting, or aerobic exercise, until your doctor says it is okay. Ask your doctor when you can drive again. Most people are able to return to work the day after the procedure. If your work requires intense activity, you may feel pain in your kidney area or get tired easily. If this happens, you may need to do less strenuous activities while  the stent is in. Ask your doctor when it is okay for you to have sex.   Diet    You can eat your normal diet. If your stomach is upset, try bland, low-fat foods like plain rice, broiled chicken, toast, and yogurt. Drink plenty of fluids (unless your doctor tells you not to).   Medicines    Your doctor will tell you if and when you can restart your medicines. He or she will also give you instructionsabout taking any new medicines. If you take blood thinners, such as warfarin (Coumadin), clopidogrel (Plavix), or aspirin, be sure to talk to your doctor. He or she will tell you if and when to start taking those medicines again. Make sure that you understandexactly what your doctor wants you to do. Take pain medicines exactly as directed. If the doctor gave you a prescription medicine for pain, take it as prescribed. If you are not taking a prescription pain medicine, ask your doctor if you can take an over-the-counter medicine. If you think your pain medicine is making you sick to your stomach: Take your medicine after meals (unless your doctor has told you not to).  If your doctor prescribed antibiotics, take them as directed. Do not stop taking them just because you feel better. You need to take the full course of antibiotics. Follow-up care is a key part  of your treatment and safety. Be sure to make and go to all appointments, and call your doctor or nurse call line if you are having problems. It's also a good idea to know your test results and keep a list of the medicines you take.  When should you call for help?   Call 911 anytime you think you may need emergency care. For example, call if:  You passed out (lost consciousness). You have severe trouble breathing. You have sudden chest pain and shortness of breath, or you cough up blood. You have severe belly pain.  Call your doctor or nurse call line now or seek immediate medical care if:  Part or all of the stent comes out of your  urethra. You have pain that does not get better after you take pain medicine. You have a large amount of blood or pus in your urine. You have a fever, chills, or body aches. You cannot control when you urinate, or you leak urine. Watch closely for changes in your health, and be sure to contact your doctor or nurse call line if you have any problems.  Post Anesthesia Home Care Instructions  Activity: Get plenty of rest for the remainder of the day. A responsible individual must stay with you for 24 hours following the procedure.  For the next 24 hours, DO NOT: -Drive a car -Paediatric nurse -Drink alcoholic beverages -Take any medication unless instructed by your physician -Make any legal decisions or sign important papers.  Meals: Start with liquid foods such as gelatin or soup. Progress to regular foods as tolerated. Avoid greasy, spicy, heavy foods. If nausea and/or vomiting occur, drink only clear liquids until the nausea and/or vomiting subsides. Call your physician if vomiting continues.  Special Instructions/Symptoms: Your throat may feel dry or sore from the anesthesia or the breathing tube placed in your throat during surgery. If this causes discomfort, gargle with warm salt water. The discomfort should disappear within 24 hours.  If you had a scopolamine patch placed behind your ear for the management of post- operative nausea and/or vomiting:  1. The medication in the patch is effective for 72 hours, after which it should be removed.  Wrap patch in a tissue and discard in the trash. Wash hands thoroughly with soap and water. 2. You may remove the patch earlier than 72 hours if you experience unpleasant side effects which may include dry mouth, dizziness or visual disturbances. 3. Avoid touching the patch. Wash your hands with soap and water after contact with the patch.

## 2017-03-22 ENCOUNTER — Encounter (HOSPITAL_BASED_OUTPATIENT_CLINIC_OR_DEPARTMENT_OTHER): Payer: Self-pay | Admitting: Urology

## 2017-03-27 ENCOUNTER — Encounter (HOSPITAL_BASED_OUTPATIENT_CLINIC_OR_DEPARTMENT_OTHER): Payer: Self-pay | Admitting: *Deleted

## 2017-03-27 NOTE — Progress Notes (Unsigned)
Urology Preoperative H&P   Chief Complain: Right flank pain  History of Present Illness: Sharon Page is a 56 y.o. with a 6 mm right midureteral calculus.  She underwent cystoscopy and attempted right ureteroscopy on 03/21/2017, but was unable to have her stone treated due to stenosis of her right ureter.  She had a right JJ stent placed at that time and is here today for definitive stone treatment.    Past Medical History:  Diagnosis Date  . Environmental allergies   . GERD (gastroesophageal reflux disease)    diet controlled -tx tums  . Hematuria   . Hiatal hernia   . History of gestational hypertension   . History of kidney stones   . History of pulmonary embolism 01/21/2011   bilateral -- RML, RLL, LLL  . Hydronephrosis, right   . OA (osteoarthritis)   . Pain in Achilles tendon    right  . Ureteropelvic junction calculus    right side  . Uterine fibroid   . Wears glasses     Past Surgical History:  Procedure Laterality Date  . [place]  . COLONOSCOPY  11/12/2013  . CYSTOSCOPY/URETEROSCOPY/HOLMIUM LASER/STENT PLACEMENT Right 03/21/2017   Procedure: CYSTOSCOPY/ RETROGRADE/URETEROSCOPY/STENT PLACEMENT;  Surgeon: Ceasar Mons, MD;  Location: [facility];  Service: Urology;  Laterality: Right;  . DILATATION & CURETTAGE/HYSTEROSCOPY WITH MYOSURE N/A 01/08/2017   Procedure: [place];  Surgeon: Megan Salon, MD;  Location: [facility];  Service: Gynecology;  Laterality: N/A;  . LAPAROSCOPIC CHOLECYSTECTOMY  1995  . STRABISMUS SURGERY Bilateral age 23  . TONSILLECTOMY  child  . TRANSTHORACIC ECHOCARDIOGRAM  02/07/2017   EF 55-60%  . WISDOM TOOTH EXTRACTION      Allergies:  Allergies  Allergen Reactions  . Ciprofloxacin Hcl Other (See Comments)    Agitation   . Codeine Other (See Comments)    agitation  . Dilaudid [Hydromorphone Hcl] Other (See Comments)    Agitation   . Morphine  And Related Other (See Comments)    Agitation     Family History  Problem Relation Age of Onset  . Diabetes Father   . Hypertension Father   . Deep vein thrombosis Father   . Congestive Heart Failure Father   . Lung cancer Mother   . Tuberculosis Mother        as a child  . Lymphoma Mother   . Non-Hodgkin's lymphoma Mother   . Skin cancer Maternal Grandmother   . Colon cancer Paternal Aunt        x2  . Heart attack Brother        half brother - 5  . Healthy Daughter   . Crohn's disease Daughter   . Allergies Daughter     Social History:  reports that she has never smoked. She has never used smokeless tobacco. She reports that she drinks alcohol. She reports that she does not use drugs.  ROS: A complete review of systems was performed.  All systems are negative except for pertinent findings as noted.  Physical Exam:  Vital signs in last 24 hours: @VSRANGES @ Constitutional:  Alert and oriented, No acute distress Cardiovascular: Regular rate and rhythm, No JVD Respiratory: Normal respiratory effort, Lungs clear bilaterally GI: Abdomen is soft, nontender, nondistended, no abdominal masses GU: No CVA tenderness Lymphatic: No lymphadenopathy Neurologic: Grossly intact, no focal deficits Psychiatric: Normal mood and affect  Laboratory Data:  No results for input(s): WBC, HGB, HCT, PLT  in the last 72 hours.  No results for input(s): NA, K, CL, GLUCOSE, BUN, CALCIUM, CREATININE in the last 72 hours.  Invalid input(s): CO3   No results found for this or any previous visit (from the past 24 hour(s)). No results found for this or any previous visit (from the past 240 hour(s)).  Renal Function: No results for input(s): CREATININE in the last 168 hours. Estimated Creatinine Clearance: 64.3 mL/min (A) (by C-G formula based on SCr of 1.07 mg/dL (H)).  Radiologic Imaging: No results found.  I independently reviewed the above imaging studies.  Assessment and Plan Sharon  A Page is a 56 y.o. female with a 6 mm right midureteral calculus  -The risks, benefits and alternatives of cystoscopy with right ureteroscopy, holmium laser lithotripsy and right ureteral stent placement was discussed with the patient.  She voices understanding and wishes to proceed.    Sharon Page 03/27/2017, 12:48 PM

## 2017-03-28 ENCOUNTER — Encounter (HOSPITAL_BASED_OUTPATIENT_CLINIC_OR_DEPARTMENT_OTHER): Payer: Self-pay | Admitting: *Deleted

## 2017-03-28 NOTE — Progress Notes (Signed)
NPO AFTER MN.  ARRIVE AT 0930.  PT NEEDS CXR AND EKG.   CURRENT LAB RESULTS IN CHART AND EPIC.  WILL TAKE PRILOSEC AND PROBIOTIC AM DOS W/ SIPS OF WATER.

## 2017-03-30 ENCOUNTER — Ambulatory Visit (HOSPITAL_BASED_OUTPATIENT_CLINIC_OR_DEPARTMENT_OTHER)
Admission: RE | Admit: 2017-03-30 | Discharge: 2017-03-30 | Disposition: A | Discharge status: Home / Self Care | Payer: 59 | Source: Ambulatory Visit | Attending: Urology | Admitting: Urology

## 2017-03-30 ENCOUNTER — Ambulatory Visit (HOSPITAL_BASED_OUTPATIENT_CLINIC_OR_DEPARTMENT_OTHER): Payer: 59 | Admitting: Anesthesiology

## 2017-03-30 ENCOUNTER — Ambulatory Visit (HOSPITAL_COMMUNITY): Payer: 59

## 2017-03-30 ENCOUNTER — Encounter (HOSPITAL_BASED_OUTPATIENT_CLINIC_OR_DEPARTMENT_OTHER): Payer: Self-pay

## 2017-03-30 ENCOUNTER — Encounter (HOSPITAL_BASED_OUTPATIENT_CLINIC_OR_DEPARTMENT_OTHER)
Admission: RE | Disposition: A | Discharge status: Home / Self Care | Payer: Self-pay | Source: Ambulatory Visit | Attending: Urology

## 2017-03-30 DIAGNOSIS — Z888 Allergy status to other drugs, medicaments and biological substances status: Secondary | ICD-10-CM | POA: Diagnosis not present

## 2017-03-30 DIAGNOSIS — Z801 Family history of malignant neoplasm of trachea, bronchus and lung: Secondary | ICD-10-CM | POA: Diagnosis not present

## 2017-03-30 DIAGNOSIS — N201 Calculus of ureter: Secondary | ICD-10-CM | POA: Diagnosis not present

## 2017-03-30 DIAGNOSIS — Z881 Allergy status to other antibiotic agents status: Secondary | ICD-10-CM | POA: Insufficient documentation

## 2017-03-30 DIAGNOSIS — Q6210 Congenital occlusion of ureter, unspecified: Secondary | ICD-10-CM | POA: Diagnosis not present

## 2017-03-30 DIAGNOSIS — Z807 Family history of other malignant neoplasms of lymphoid, hematopoietic and related tissues: Secondary | ICD-10-CM | POA: Insufficient documentation

## 2017-03-30 DIAGNOSIS — M1990 Unspecified osteoarthritis, unspecified site: Secondary | ICD-10-CM | POA: Insufficient documentation

## 2017-03-30 DIAGNOSIS — Z9049 Acquired absence of other specified parts of digestive tract: Secondary | ICD-10-CM | POA: Diagnosis not present

## 2017-03-30 DIAGNOSIS — Z01818 Encounter for other preprocedural examination: Secondary | ICD-10-CM | POA: Diagnosis not present

## 2017-03-30 DIAGNOSIS — Z8379 Family history of other diseases of the digestive system: Secondary | ICD-10-CM | POA: Insufficient documentation

## 2017-03-30 DIAGNOSIS — Z8249 Family history of ischemic heart disease and other diseases of the circulatory system: Secondary | ICD-10-CM | POA: Diagnosis not present

## 2017-03-30 DIAGNOSIS — Z800 Family history of malignant neoplasm of digestive organs: Secondary | ICD-10-CM | POA: Diagnosis not present

## 2017-03-30 DIAGNOSIS — K449 Diaphragmatic hernia without obstruction or gangrene: Secondary | ICD-10-CM | POA: Diagnosis not present

## 2017-03-30 DIAGNOSIS — I491 Atrial premature depolarization: Secondary | ICD-10-CM | POA: Diagnosis not present

## 2017-03-30 DIAGNOSIS — K5900 Constipation, unspecified: Secondary | ICD-10-CM | POA: Diagnosis not present

## 2017-03-30 DIAGNOSIS — Z6841 Body Mass Index (BMI) 40.0 and over, adult: Secondary | ICD-10-CM | POA: Diagnosis not present

## 2017-03-30 DIAGNOSIS — K219 Gastro-esophageal reflux disease without esophagitis: Secondary | ICD-10-CM | POA: Insufficient documentation

## 2017-03-30 DIAGNOSIS — Z87442 Personal history of urinary calculi: Secondary | ICD-10-CM | POA: Diagnosis not present

## 2017-03-30 DIAGNOSIS — Z885 Allergy status to narcotic agent status: Secondary | ICD-10-CM | POA: Diagnosis not present

## 2017-03-30 DIAGNOSIS — Z833 Family history of diabetes mellitus: Secondary | ICD-10-CM | POA: Insufficient documentation

## 2017-03-30 DIAGNOSIS — Z836 Family history of other diseases of the respiratory system: Secondary | ICD-10-CM | POA: Insufficient documentation

## 2017-03-30 DIAGNOSIS — I517 Cardiomegaly: Secondary | ICD-10-CM | POA: Diagnosis not present

## 2017-03-30 DIAGNOSIS — Z86711 Personal history of pulmonary embolism: Secondary | ICD-10-CM | POA: Diagnosis not present

## 2017-03-30 HISTORY — PX: CYSTOSCOPY/URETEROSCOPY/HOLMIUM LASER/STENT PLACEMENT: SHX6546

## 2017-03-30 HISTORY — PX: HOLMIUM LASER APPLICATION: SHX5852

## 2017-03-30 HISTORY — DX: Frequency of micturition: R35.0

## 2017-03-30 HISTORY — DX: Calculus of ureter: N20.1

## 2017-03-30 HISTORY — DX: Urgency of urination: R39.15

## 2017-03-30 HISTORY — DX: Constipation, unspecified: K59.00

## 2017-03-30 SURGERY — CYSTOSCOPY/URETEROSCOPY/HOLMIUM LASER/STENT PLACEMENT
Anesthesia: General | Site: Renal | Laterality: Right

## 2017-03-30 MED ORDER — ONDANSETRON HCL 4 MG/2ML IJ SOLN
INTRAMUSCULAR | Status: DC | PRN
  Administered 2017-03-30: 4 mg via INTRAVENOUS

## 2017-03-30 MED ORDER — PROPOFOL 10 MG/ML IV BOLUS
INTRAVENOUS | Status: AC
  Filled 2017-03-30: qty 20

## 2017-03-30 MED ORDER — ONDANSETRON HCL 4 MG/2ML IJ SOLN
INTRAMUSCULAR | Status: AC
  Filled 2017-03-30: qty 2

## 2017-03-30 MED ORDER — SODIUM CHLORIDE 0.9 % IR SOLN
Status: DC | PRN
  Administered 2017-03-30: 4000 mL

## 2017-03-30 MED ORDER — FENTANYL CITRATE (PF) 100 MCG/2ML IJ SOLN
INTRAMUSCULAR | Status: DC | PRN
  Administered 2017-03-30 (×2): 25 ug via INTRAVENOUS
  Administered 2017-03-30: 50 ug via INTRAVENOUS

## 2017-03-30 MED ORDER — FENTANYL CITRATE (PF) 100 MCG/2ML IJ SOLN
INTRAMUSCULAR | Status: AC
  Filled 2017-03-30: qty 2

## 2017-03-30 MED ORDER — DEXAMETHASONE SODIUM PHOSPHATE 4 MG/ML IJ SOLN
INTRAMUSCULAR | Status: DC | PRN
  Administered 2017-03-30: 10 mg via INTRAVENOUS

## 2017-03-30 MED ORDER — LIDOCAINE 2% (20 MG/ML) 5 ML SYRINGE
INTRAMUSCULAR | Status: AC
  Filled 2017-03-30: qty 5

## 2017-03-30 MED ORDER — MIDAZOLAM HCL 5 MG/5ML IJ SOLN
INTRAMUSCULAR | Status: DC | PRN
  Administered 2017-03-30: 2 mg via INTRAVENOUS

## 2017-03-30 MED ORDER — KETOROLAC TROMETHAMINE 10 MG PO TABS: 10.0000 mg | ORAL_TABLET | Freq: Four times a day (QID) | ORAL | 0 refills | Status: DC | PRN

## 2017-03-30 MED ORDER — METOCLOPRAMIDE HCL 5 MG/ML IJ SOLN
INTRAMUSCULAR | Status: DC | PRN
  Administered 2017-03-30: 5 mg via INTRAVENOUS

## 2017-03-30 MED ORDER — METOCLOPRAMIDE HCL 5 MG/ML IJ SOLN
INTRAMUSCULAR | Status: AC
  Filled 2017-03-30: qty 2

## 2017-03-30 MED ORDER — DEXAMETHASONE SODIUM PHOSPHATE 10 MG/ML IJ SOLN
INTRAMUSCULAR | Status: AC
  Filled 2017-03-30: qty 1

## 2017-03-30 MED ORDER — ACETAMINOPHEN 160 MG/5ML PO SOLN
325.0000 mg | ORAL | Status: DC | PRN
  Filled 2017-03-30: qty 20.3

## 2017-03-30 MED ORDER — FENTANYL CITRATE (PF) 100 MCG/2ML IJ SOLN
25.0000 ug | INTRAMUSCULAR | Status: DC | PRN
  Filled 2017-03-30: qty 1

## 2017-03-30 MED ORDER — PROPOFOL 10 MG/ML IV BOLUS
INTRAVENOUS | Status: DC | PRN
  Administered 2017-03-30: 170 mg via INTRAVENOUS

## 2017-03-30 MED ORDER — LACTATED RINGERS IV SOLN
INTRAVENOUS | Status: DC
  Administered 2017-03-30: 11:00:00 via INTRAVENOUS
  Filled 2017-03-30: qty 1000

## 2017-03-30 MED ORDER — CEFAZOLIN SODIUM-DEXTROSE 2-4 GM/100ML-% IV SOLN
2.0000 g | INTRAVENOUS | Status: AC
  Administered 2017-03-30: 2 g via INTRAVENOUS
  Filled 2017-03-30: qty 100

## 2017-03-30 MED ORDER — CEFAZOLIN SODIUM-DEXTROSE 2-4 GM/100ML-% IV SOLN
INTRAVENOUS | Status: AC
  Filled 2017-03-30: qty 100

## 2017-03-30 MED ORDER — KETOROLAC TROMETHAMINE 30 MG/ML IJ SOLN
INTRAMUSCULAR | Status: AC
  Filled 2017-03-30: qty 1

## 2017-03-30 MED ORDER — ACETAMINOPHEN 325 MG PO TABS
325.0000 mg | ORAL_TABLET | ORAL | Status: DC | PRN
  Filled 2017-03-30: qty 2

## 2017-03-30 MED ORDER — LIDOCAINE 2% (20 MG/ML) 5 ML SYRINGE
INTRAMUSCULAR | Status: DC | PRN
  Administered 2017-03-30: 60 mg via INTRAVENOUS

## 2017-03-30 MED ORDER — MIDAZOLAM HCL 2 MG/2ML IJ SOLN
INTRAMUSCULAR | Status: AC
  Filled 2017-03-30: qty 2

## 2017-03-30 MED ORDER — KETOROLAC TROMETHAMINE 15 MG/ML IJ SOLN
INTRAMUSCULAR | Status: DC | PRN
  Administered 2017-03-30: 15 mg via INTRAVENOUS

## 2017-03-30 MED FILL — KETOROLAC 10 MG TABLET: 10 | 5 days supply | Qty: 20 | Fill #0

## 2017-03-30 SURGICAL SUPPLY — 28 items
BAG DRAIN URO-CYSTO SKYTR STRL (DRAIN) ×2 IMPLANT
BAG DRN UROCATH (DRAIN) ×1
BASKET LASER NITINOL 1.9FR (BASKET) ×1 IMPLANT
BASKET STONE 1.7 NGAGE (UROLOGICAL SUPPLIES) ×1 IMPLANT
BSKT STON RTRVL 120 1.9FR (BASKET)
CATH INTERMIT  6FR 70CM (CATHETERS) IMPLANT
CLOTH BEACON ORANGE TIMEOUT ST (SAFETY) ×2 IMPLANT
FIBER LASER FLEXIVA 365 (UROLOGICAL SUPPLIES) IMPLANT
FIBER LASER TRAC TIP (UROLOGICAL SUPPLIES) ×1 IMPLANT
GLOVE BIO SURGEON STRL SZ7.5 (GLOVE) ×2 IMPLANT
GLOVE INDICATOR 8.0 STRL GRN (GLOVE) ×2 IMPLANT
GOWN STRL REUS W/TWL LRG LVL3 (GOWN DISPOSABLE) ×2 IMPLANT
GOWN STRL REUS W/TWL XL LVL3 (GOWN DISPOSABLE) ×1 IMPLANT
GUIDEWIRE ANG ZIPWIRE 038X150 (WIRE) ×3 IMPLANT
GUIDEWIRE STR DUAL SENSOR (WIRE) ×2 IMPLANT
INFUSOR MANOMETER BAG 3000ML (MISCELLANEOUS) ×2 IMPLANT
IV NS 1000ML (IV SOLUTION) ×2
IV NS 1000ML BAXH (IV SOLUTION) ×1 IMPLANT
IV NS IRRIG 3000ML ARTHROMATIC (IV SOLUTION) ×2 IMPLANT
KIT RM TURNOVER CYSTO AR (KITS) ×2 IMPLANT
MANIFOLD NEPTUNE II (INSTRUMENTS) ×2 IMPLANT
NS IRRIG 500ML POUR BTL (IV SOLUTION) ×2 IMPLANT
PACK CYSTO (CUSTOM PROCEDURE TRAY) ×2 IMPLANT
SHEATH ACCESS URETERAL 38CM (SHEATH) ×1 IMPLANT
STENT URET 6FRX24 CONTOUR (STENTS) ×1 IMPLANT
SYRINGE 10CC LL (SYRINGE) ×2 IMPLANT
TUBE CONNECTING 12X1/4 (SUCTIONS) IMPLANT
TUBE FEEDING 8FR 16IN STR KANG (MISCELLANEOUS) ×1 IMPLANT

## 2017-03-30 NOTE — Op Note (Signed)
Operative Note  Preoperative diagnosis:  1. 6 mm right UPJ calculus  Postoperative diagnosis: 1. same  Procedure(s): 1. Cystoscopy 2. Right ureteroscopy 3. Homing laser lithotripsy 4. Right 6 French JJ stent placement  Surgeon: Ellison Hughs, MD  Assistants:none  Anesthesia: Gen.  Complications: none  EBL: less than 5 mL  Specimens: 1. Right ureteral calculus  Drains/Catheters: 1. Right 6 [place] JJ stent  Intraoperative findings: 6 mm calculus  Indication: Sharon Page is a 56 year old female with a 6 mm right UPJ calculus. She underwent a right ureteroscopy on 03/21/2017, but her stone was unable to be treated due to stenosis of her right ureter at the level of her iliac vessels. She is here today for the above procedures, voices understanding and wishes to proceed.  Description of procedure:  After informed consent was obtained, the patient was brought to the operating room and general LMA anesthesia was administered. The patient was then placed in the dorsolithotomy position and prepped and draped in usual sterile fashion. A timeout was performed. A 21 French rigid cystoscope was then inserted into the urethral meatus and advanced into the bladder under direct vision. A complete bladder survey revealed no intravesical pathology.  Her previously placed right JJ stent was then grasped and retracted to the urethral meatus. A floppy tip Glidewire was then used to intubate the lumen of the stent was advanced up to the right renal pelvis, under fluoroscopic guidance. The stent was then removed over the wire, inspected and discarded. An additional Glidewire was also advanced up the right ureter in a retrograde fashion, confirming placement by fluoroscopy. A ureteral access sheath was then placed over the safety wire and into position within the proximal aspects of the right ureter, under fluoroscopic guidance. A flexible ureteroscope was then advanced through the lumen of the  access sheath and into the proximal right ureter where her calculus was identified. Her stone quickly migrated into the renal pelvis. A 200  holmium laser was then used to fracture the stone into multiple smaller pieces. An engage basket was then used to retrieve all stone fragments from the right renal pelvis. Full inspection of the right renal pelvis revealed no additional stones or stone fragments. The flexible ureteroscope was then removed under direct vision, identifying no obvious ureteral trauma. A 6 [place] JJ stent was then placed over the Glidewire and into good position within the right collecting system, confirmed by fluoroscopy. The patient's bladder was completely drained. She tolerated the procedure well and was transferred to the postanesthesia in stable condition.   Plan: Follow-up on 04/05/2017 for cystoscopy and stent removal.

## 2017-03-30 NOTE — Transfer of Care (Signed)
  Last Vitals:  Vitals:   03/30/17 0950  BP: 124/76  Pulse: 61  Resp: 16  Temp: 36.9 C  SpO2: 97%    Last Pain:  Vitals:   03/30/17 0950  TempSrc: Oral      Patients Stated Pain Goal: 5 (03/30/17 0950)  Immediate Anesthesia Transfer of Care Note  Patient: Sharon Page  Procedure(s) Performed: Procedure(s) (LRB): CYSTOSCOPY/URETEROSCOPY/HOLMIUM LASER/STENT EXCHANGE (Right) HOLMIUM LASER APPLICATION (Right)  Patient Location: PACU  Anesthesia Type: General  Level of Consciousness: awake, alert  and oriented  Airway & Oxygen Therapy: Patient Spontanous Breathing and Patient connected to nasal cannula oxygen  Post-op Assessment: Report given to PACU RN and Post -op Vital signs reviewed and stable  Post vital signs: Reviewed and stable  Complications: No apparent anesthesia complications

## 2017-03-30 NOTE — Discharge Instructions (Signed)
[facility] °[phone] °Post Ureteroscopy With or Without Stent Instructions ° °Definitions: ° °Ureter: The duct that transports urine from the kidney to the bladder. °Stent:   A plastic hollow tube that is placed into the ureter, from the kidney to the                 bladder to prevent the ureter from swelling shut. ° °GENERAL INSTRUCTIONS: ° °Despite the fact that no skin incisions were used, the area around the ureter and bladder is raw and irritated. The stent is a foreign body which will further irritate the bladder wall. This irritation is manifested by increased frequency of urination, both day and night, and by an increase in the urge to urinate. In some, the urge to urinate is present almost always. Sometimes the urge is strong enough that you may not be able to stop yourself from urinating. The only real cure is to remove the stent and then give time for the bladder wall to heal which can't be done until the danger of the ureter swelling shut has passed, which varies. ° °You may see some blood in your urine while the stent is in place and a few days afterwards. Do not be alarmed, even if the urine was clear for a while. Get off your feet and drink lots of fluids until clearing occurs. If you start to pass clots or don't improve, call us. ° °DIET: °You may return to your normal diet immediately. Because of the raw surface of your bladder, alcohol, spicy foods, acid type foods and drinks with caffeine may cause irritation or frequency and should be used in moderation. To keep your urine flowing freely and to avoid constipation, drink plenty of fluids during the day ( 8-10 glasses ). °Tip: Avoid cranberry juice because it is very acidic. ° °ACTIVITY: °Your physical activity doesn't need to be restricted. However, if you are very active, you may see some blood in your urine. We suggest that you reduce your activity under these circumstances until the bleeding has stopped. ° °BOWELS: °It is  important to keep your bowels regular during the postoperative period. Straining with bowel movements can cause bleeding. A bowel movement every other day is reasonable. Use a mild laxative if needed, such as Milk of Magnesia 2-3 tablespoons, or 2 Dulcolax tablets. Call if you continue to have problems. If you have been taking narcotics for pain, before, during or after your surgery, you may be constipated. Take a laxative if necessary. ° ° °MEDICATION: °You should resume your pre-surgery medications unless told not to. In addition you will often be given an antibiotic to prevent infection. These should be taken as prescribed until the bottles are finished unless you are having an unusual reaction to one of the drugs. ° °PROBLEMS YOU SHOULD REPORT TO US: °· Fevers over 100.5 Fahrenheit. °· Heavy bleeding, or clots ( See above notes about blood in urine ). °· Inability to urinate. °· Drug reactions ( hives, rash, nausea, vomiting, diarrhea ). °· Severe burning or pain with urination that is not improving. ° °FOLLOW-UP: °You will need a follow-up appointment to monitor your progress. Call for this appointment at the number listed above. Usually the first appointment will be about three to fourteen days after your surgery. ° ° ° ° ° °Post Anesthesia Home Care Instructions ° °Activity: °Get plenty of rest for the remainder of the day. A responsible individual must stay with you for 24 hours following the procedure.  °  For the next 24 hours, DO NOT: °-Drive a car °-Operate machinery °-Drink alcoholic beverages °-Take any medication unless instructed by your physician °-Make any legal decisions or sign important papers. ° °Meals: °Start with liquid foods such as gelatin or soup. Progress to regular foods as tolerated. Avoid greasy, spicy, heavy foods. If nausea and/or vomiting occur, drink only clear liquids until the nausea and/or vomiting subsides. Call your physician if vomiting continues. ° °Special  Instructions/Symptoms: °Your throat may feel dry or sore from the anesthesia or the breathing tube placed in your throat during surgery. If this causes discomfort, gargle with warm salt water. The discomfort should disappear within 24 hours. ° °If you had a scopolamine patch placed behind your ear for the management of post- operative nausea and/or vomiting: ° °1. The medication in the patch is effective for 72 hours, after which it should be removed.  Wrap patch in a tissue and discard in the trash. Wash hands thoroughly with soap and water. °2. You may remove the patch earlier than 72 hours if you experience unpleasant side effects which may include dry mouth, dizziness or visual disturbances. °3. Avoid touching the patch. Wash your hands with soap and water after contact with the patch. °  ° °

## 2017-03-30 NOTE — Interval H&P Note (Signed)
History and Physical Interval Note:  03/30/2017 10:52 AM  Sharon Page  has presented today for surgery, with the diagnosis of RIGHT URETERAL STONE  The various methods of treatment have been discussed with the patient and family. After consideration of risks, benefits and other options for treatment, the patient has consented to  Procedure(s) with comments: CYSTOSCOPY/URETEROSCOPY/HOLMIUM LASER/STENT PLACEMENT (Right) - NEEDS 45 MIN TOTAL as a surgical intervention .  The patient's history has been reviewed, patient examined, no change in status, stable for surgery.  I have reviewed the patient's chart and labs.  Questions were answered to the patient's satisfaction.     Conception Oms Mindy Behnken

## 2017-03-30 NOTE — Anesthesia Procedure Notes (Signed)
Procedure Name: LMA Insertion Date/Time: 03/30/2017 11:00 AM Performed by: Denna Haggard D Pre-anesthesia Checklist: Patient identified, Emergency Drugs available, Suction available and Patient being monitored Patient Re-evaluated:Patient Re-evaluated prior to induction Oxygen Delivery Method: Circle system utilized Preoxygenation: Pre-oxygenation with 100% oxygen Induction Type: IV induction Ventilation: Mask ventilation without difficulty LMA: LMA inserted LMA Size: 4.0 Number of attempts: 1 Airway Equipment and Method: Bite block Placement Confirmation: positive ETCO2 Tube secured with: Tape Dental Injury: Teeth and Oropharynx as per pre-operative assessment

## 2017-03-30 NOTE — Anesthesia Preprocedure Evaluation (Signed)
Anesthesia Evaluation  Patient identified by MRN, date of birth, ID band Patient awake    Reviewed: Allergy & Precautions, H&P , NPO status , Patient's Chart, lab work & pertinent test results  History of Anesthesia Complications Negative for: history of anesthetic complications  Airway Mallampati: II  TM Distance: >3 FB Neck ROM: full    Dental  (+) Teeth Intact   Pulmonary neg pulmonary ROS,    breath sounds clear to auscultation       Cardiovascular negative cardio ROS   Rhythm:regular Rate:Normal     Neuro/Psych negative neurological ROS  negative psych ROS   GI/Hepatic Neg liver ROS, hiatal hernia, GERD  ,  Endo/Other  Morbid obesity  Renal/GU Renal disease     Musculoskeletal  (+) Arthritis ,   Abdominal (+) + obese,   Peds  Hematology negative hematology ROS (+)   Anesthesia Other Findings   Reproductive/Obstetrics                             Anesthesia Physical Anesthesia Plan  ASA: III  Anesthesia Plan: General   Post-op Pain Management:    Induction: Intravenous  PONV Risk Score and Plan: 3 and Ondansetron and Dexamethasone  Airway Management Planned: LMA  Additional Equipment: None  Intra-op Plan:   Post-operative Plan: Extubation in OR  Informed Consent: I have reviewed the patients History and Physical, chart, labs and discussed the procedure including the risks, benefits and alternatives for the proposed anesthesia with the patient or authorized representative who has indicated his/her understanding and acceptance.   Dental advisory given  Plan Discussed with: CRNA and Surgeon  Anesthesia Plan Comments:         Anesthesia Quick Evaluation

## 2017-03-30 NOTE — H&P (Signed)
Urology Preoperative H&P   Chief Complaint: right flank pain  History of Present Illness: Ryleeann A Patricelli is a 56 y.o. with a history of a 6 mm right midureteral calculus.  She underwent cystoscopy and right ureteroscopy, but her ureter would not accommodate the flexible ureteroscope.  She had a stent placed at that time is here today for definitive stone treatment.  She denies interval fevers/chills, nausea/vomiting or new voiding complaints.   Past Medical History:  Diagnosis Date  . Constipation   . Environmental allergies   . Frequency of urination   . GERD (gastroesophageal reflux disease)    diet controlled -tx tums  . Hematuria   . Hiatal hernia   . History of gestational hypertension   . History of kidney stones   . History of pulmonary embolism 01/21/2011   bilateral -- RML, RLL, LLL  . OA (osteoarthritis)   . Pain in Achilles tendon    right  . Right ureteral stone   . Urgency of urination   . Uterine fibroid   . Wears glasses     Past Surgical History:  Procedure Laterality Date  . [place]  . COLONOSCOPY  11/12/2013  . CYSTOSCOPY/URETEROSCOPY/HOLMIUM LASER/STENT PLACEMENT Right 03/21/2017   Procedure: CYSTOSCOPY/ RETROGRADE/URETEROSCOPY/STENT PLACEMENT;  Surgeon: Ceasar Mons, MD;  Location: [facility];  Service: Urology;  Laterality: Right;  . DILATATION & CURETTAGE/HYSTEROSCOPY WITH MYOSURE N/A 01/08/2017   Procedure: [place];  Surgeon: Megan Salon, MD;  Location: [facility];  Service: Gynecology;  Laterality: N/A;  . LAPAROSCOPIC CHOLECYSTECTOMY  1995  . STRABISMUS SURGERY Bilateral age 89  . TONSILLECTOMY  child  . TRANSTHORACIC ECHOCARDIOGRAM  02/07/2017   EF 55-60%  . WISDOM TOOTH EXTRACTION      Allergies:  Allergies  Allergen Reactions  . Ciprofloxacin Hcl Other (See Comments)    Agitation   . Codeine Other (See Comments)    agitation  . Dilaudid  [Hydromorphone Hcl] Other (See Comments)    Agitation   . Morphine And Related Other (See Comments)    Agitation     Family History  Problem Relation Age of Onset  . Diabetes Father   . Hypertension Father   . Deep vein thrombosis Father   . Congestive Heart Failure Father   . Lung cancer Mother   . Tuberculosis Mother        as a child  . Lymphoma Mother   . Non-Hodgkin's lymphoma Mother   . Skin cancer Maternal Grandmother   . Colon cancer Paternal Aunt        x2  . Heart attack Brother        half brother - 28  . Healthy Daughter   . Crohn's disease Daughter   . Allergies Daughter     Social History:  reports that she has never smoked. She has never used smokeless tobacco. She reports that she drinks alcohol. She reports that she does not use drugs.  ROS: A complete review of systems was performed.  All systems are negative except for pertinent findings as noted.  Physical Exam:  Vital signs in last 24 hours:   Constitutional:  Alert and oriented, No acute distress Cardiovascular: Regular rate and rhythm, No JVD Respiratory: Normal respiratory effort, Lungs clear bilaterally GI: Abdomen is soft, nontender, nondistended, no abdominal masses GU: No CVA tenderness Lymphatic: No lymphadenopathy Neurologic: Grossly intact, no focal deficits Psychiatric: Normal mood and affect  Laboratory Data:  No results for input(s): WBC, HGB, HCT, PLT in the last 72 hours.  No results for input(s): NA, K, CL, GLUCOSE, BUN, CALCIUM, CREATININE in the last 72 hours.  Invalid input(s): CO3   No results found for this or any previous visit (from the past 24 hour(s)). No results found for this or any previous visit (from the past 240 hour(s)).  Renal Function: No results for input(s): CREATININE in the last 168 hours. CrCl cannot be calculated (Patient's most recent lab result is older than the maximum 21 days allowed.).  Radiologic Imaging: No results found.  I  independently reviewed the above imaging studies.  Assessment and Plan Marisal A Grismore is a 56 y.o. female with a 6 mm right midureteral calculus  -The risks, benefits and alternatives of cystoscopy with right ureteroscopy, holmium laser lithotripsy and right JJ stent placement was discussed with the patient.  She voices understanding and wishes to proceed.    Ellison Hughs, MD 03/30/2017, 8:32 AM  [facility] Pager: [phone]

## 2017-04-01 NOTE — Anesthesia Postprocedure Evaluation (Signed)
Anesthesia Post Note  Patient: Sharon Page  Procedure(s) Performed: Procedure(s) (LRB): CYSTOSCOPY/URETEROSCOPY/HOLMIUM LASER/STENT EXCHANGE (Right) HOLMIUM LASER APPLICATION (Right)     Patient location during evaluation: PACU Anesthesia Type: General Level of consciousness: awake and alert Pain management: pain level controlled Vital Signs Assessment: post-procedure vital signs reviewed and stable Respiratory status: spontaneous breathing, nonlabored ventilation, respiratory function stable and patient connected to nasal cannula oxygen Cardiovascular status: blood pressure returned to baseline and stable Postop Assessment: no signs of nausea or vomiting Anesthetic complications: no    Last Vitals:  Vitals:   03/30/17 1209 03/30/17 1237  BP:  122/74  Pulse: 68 70  Resp: 13 14  Temp:  (!) 36.4 C  SpO2: 94% 96%    Last Pain:  Vitals:   03/30/17 0950  TempSrc: Oral                 Breck [place]

## 2017-04-02 ENCOUNTER — Encounter (HOSPITAL_BASED_OUTPATIENT_CLINIC_OR_DEPARTMENT_OTHER): Payer: Self-pay | Admitting: Urology

## 2017-04-05 DIAGNOSIS — N201 Calculus of ureter: Secondary | ICD-10-CM | POA: Diagnosis not present

## 2017-04-10 ENCOUNTER — Encounter: Payer: Self-pay | Admitting: Physician Assistant

## 2017-04-12 ENCOUNTER — Ambulatory Visit: Payer: 59 | Admitting: Podiatry

## 2017-04-12 ENCOUNTER — Other Ambulatory Visit: Payer: Self-pay | Admitting: Obstetrics & Gynecology

## 2017-04-12 DIAGNOSIS — Z1231 Encounter for screening mammogram for malignant neoplasm of breast: Secondary | ICD-10-CM

## 2017-05-14 ENCOUNTER — Ambulatory Visit
Admission: RE | Admit: 2017-05-14 | Discharge: 2017-05-14 | Disposition: A | Discharge status: Home / Self Care | Payer: 59 | Source: Ambulatory Visit | Attending: Obstetrics & Gynecology | Admitting: Obstetrics & Gynecology

## 2017-05-14 DIAGNOSIS — Z1231 Encounter for screening mammogram for malignant neoplasm of breast: Secondary | ICD-10-CM | POA: Diagnosis not present

## 2017-05-28 DIAGNOSIS — N201 Calculus of ureter: Secondary | ICD-10-CM | POA: Diagnosis not present

## 2017-08-28 ENCOUNTER — Encounter: Payer: Self-pay | Admitting: Podiatry

## 2017-08-28 ENCOUNTER — Telehealth: Payer: Self-pay | Admitting: *Deleted

## 2017-08-28 ENCOUNTER — Ambulatory Visit (INDEPENDENT_AMBULATORY_CARE_PROVIDER_SITE_OTHER): Payer: 59 | Admitting: Podiatry

## 2017-08-28 DIAGNOSIS — M7661 Achilles tendinitis, right leg: Secondary | ICD-10-CM

## 2017-08-28 NOTE — Progress Notes (Signed)
She presents today for follow-up of her right foot pain stating that is mostly resolved but is still reluctant to be overly active as she refers to her Achilles tendinitis of the right foot.  Objective: Vital signs are stable she is alert and oriented x3.  Pulses are palpable.  Neurologic sensorium is intact.  Deep tendon reflexes are intact.  Muscle strength is normal symmetrical bilateral.  She has some tenderness on palpation of the distal lateral fibers of the Achilles.  Assessment: Residual Achilles tendinitis right heel.  Plan: At this point I feel physical therapy would be a good alternative to consider getting her back to her pre-tendinitis state.  We sent her to physical therapy in [place].

## 2017-08-28 NOTE — Telephone Encounter (Signed)
-----   Message from [place] sent at 08/28/2017  3:16 PM EST ----- Regarding: PT referral  [facility]  [address] [place], [place],  [address] [phone]   Patient would like to use this PT  Thanks!!

## 2017-08-28 NOTE — Telephone Encounter (Signed)
Faxed required form and demographics to [facility].

## 2017-09-03 DIAGNOSIS — M7661 Achilles tendinitis, right leg: Secondary | ICD-10-CM | POA: Diagnosis not present

## 2017-09-03 DIAGNOSIS — R2689 Other abnormalities of gait and mobility: Secondary | ICD-10-CM | POA: Diagnosis not present

## 2017-09-03 DIAGNOSIS — M25671 Stiffness of right ankle, not elsewhere classified: Secondary | ICD-10-CM | POA: Diagnosis not present

## 2017-09-10 DIAGNOSIS — M25671 Stiffness of right ankle, not elsewhere classified: Secondary | ICD-10-CM | POA: Diagnosis not present

## 2017-09-10 DIAGNOSIS — M7661 Achilles tendinitis, right leg: Secondary | ICD-10-CM | POA: Diagnosis not present

## 2017-09-10 DIAGNOSIS — R2689 Other abnormalities of gait and mobility: Secondary | ICD-10-CM | POA: Diagnosis not present

## 2017-09-14 ENCOUNTER — Ambulatory Visit (INDEPENDENT_AMBULATORY_CARE_PROVIDER_SITE_OTHER): Payer: 59 | Admitting: Family Medicine

## 2017-09-14 ENCOUNTER — Encounter: Payer: Self-pay | Admitting: Family Medicine

## 2017-09-14 ENCOUNTER — Other Ambulatory Visit: Payer: Self-pay

## 2017-09-14 VITALS — BP 138/90 | HR 101 | Temp 99.5°F

## 2017-09-14 DIAGNOSIS — B349 Viral infection, unspecified: Secondary | ICD-10-CM

## 2017-09-14 DIAGNOSIS — R05 Cough: Secondary | ICD-10-CM | POA: Diagnosis not present

## 2017-09-14 DIAGNOSIS — R059 Cough, unspecified: Secondary | ICD-10-CM

## 2017-09-14 LAB — POC INFLUENZA A&B (BINAX/QUICKVUE)
INFLUENZA A, POC: NEGATIVE
INFLUENZA B, POC: NEGATIVE

## 2017-09-14 NOTE — Progress Notes (Signed)
Subjective   CC:  Chief Complaint  Patient presents with  . Cough    Productive, clear mucus, fever    HPI: Sharon Page is a 57 y.o. female who presents to the office today to address the problems listed above in the chief complaint.  Patient complains of flu like symptoms including myalgias, subjective fever and chills, ST, mild cough and some congestion. Sxs have been present for 1-2 days. She has tried to alleviate the sxs with over-the-counter medicines with mild relief. No high risk factors for influenza complications or high risk household contacts are present. No SOB or CP are present. Taking in fluids adequately; decreased appetite bt no significant n/v/d. She has not had the flu vaccine this season.  I reviewed the patients updated PMH, FH, and SocHx.    Patient Active Problem List   Diagnosis Date Noted  . Tendonitis, Achilles, right 01/23/2017  . Cardiomegaly 01/23/2017  . Healed or old pulmonary embolism 10/31/2013  . Family history of colon cancer 10/31/2013   Current Meds  Medication Sig  . Ascorbic Acid (VITAMIN C) 1000 MG tablet Take 1,000 mg by mouth daily.  . B Complex-C (SUPER B COMPLEX PO) Take 1 capsule by mouth daily.  . calcium carbonate (TUMS - DOSED IN MG ELEMENTAL CALCIUM) 500 MG chewable tablet Chew 3 tablets by mouth daily as needed for indigestion or heartburn.  . Cholecalciferol (VITAMIN D3) 2000 units TABS Take 2,000 Int'l Units by mouth daily.  . diclofenac sodium (VOLTAREN) 1 % GEL Apply 4 g topically 4 (four) times daily. (Patient taking differently: Apply 4 g topically 4 (four) times daily as needed. )  . docusate sodium (COLACE) 100 MG capsule Take 100 mg by mouth 2 (two) times daily.  Marland Kitchen EVENING PRIMROSE OIL PO Take 1,300 mg by mouth daily.   . Ginkgo Biloba 120 MG TABS Take 120 mg by mouth daily.  Marland Kitchen ketorolac (TORADOL) 10 MG tablet Take 1 tablet (10 mg total) by mouth every 6 (six) hours as needed.  Marland Kitchen MAGNESIUM CITRATE PO Take 400 mg by  mouth at bedtime.  . magnesium oxide (MAG-OX) 400 MG tablet Take by mouth.  . Multiple Vitamin (MULTIVITAMIN) capsule Take by mouth.  . NONFORMULARY OR COMPOUNDED ITEM Vit E vaginal suppositories.  200U/ml.  One PV three times weekly.  Ernestine Conrad 3-6-9 Fatty Acids (OMEGA-3-6-9 PO) Take 1,600 mg by mouth daily.  Marland Kitchen omeprazole (PRILOSEC) 20 MG capsule Take 1 capsule (20 mg total) by mouth daily. (Patient taking differently: Take 20 mg by mouth as needed. )  . ondansetron (ZOFRAN ODT) 4 MG disintegrating tablet Take 1 tablet (4 mg total) by mouth every 8 (eight) hours as needed for nausea or vomiting.  Marland Kitchen oxyCODONE-acetaminophen (PERCOCET/ROXICET) 5-325 MG tablet Take 1 tablet by mouth every 6 (six) hours as needed for severe pain.  . Probiotic Product (PROBIOTIC PO) Take 1 capsule by mouth daily.  . sennosides-docusate sodium (SENOKOT-S) 8.6-50 MG tablet Take 1 tablet by mouth 2 (two) times daily as needed for constipation.  . tamsulosin (FLOMAX) 0.4 MG CAPS capsule Take 1 capsule (0.4 mg total) by mouth daily. (Patient taking differently: Take 0.4 mg by mouth daily after supper. )  . vitamin B-12 (CYANOCOBALAMIN) 1000 MCG tablet Take 1,000 mcg by mouth daily.   Family History: Patient family history includes Allergies in her daughter; Colon cancer in her paternal aunt; Congestive Heart Failure in her father; Crohn's disease in her daughter; Deep vein thrombosis in her father; Diabetes  in her father; Healthy in her daughter; Heart attack in her brother; Hypertension in her father; Lung cancer in her mother; Lymphoma in her mother; Non-Hodgkin's lymphoma in her mother; Skin cancer in her maternal grandmother; Tuberculosis in her mother. Social History:  Patient  reports that  has never smoked. she has never used smokeless tobacco. She reports that she drinks alcohol. She reports that she does not use drugs.  Review of Systems: Constitutional: negative for fever or malaise Ophthalmic: negative for  photophobia, double vision or loss of vision Cardiovascular: negative for chest pain, dyspnea on exertion, or new LE swelling Respiratory: negative for SOB or persistent cough Gastrointestinal: negative for abdominal pain, change in bowel habits or melena Genitourinary: negative for dysuria or gross hematuria Musculoskeletal: negative for new gait disturbance or muscular weakness Integumentary: negative for new or persistent rashes Neurological: negative for TIA or stroke symptoms Psychiatric: negative for SI or delusions Allergic/Immunologic: negative for hives  Objective  Vitals: BP 138/90   Pulse (!) 101   Temp 99.5 F (37.5 C)   LMP 12/28/2010  General: no acute respiratory distress  Psych:  Alert and oriented, normal mood and affect HEENT: Normocephalic, nasal congestion present, TMs w/o erythema, OP with minimal erythema w/o exudate, no LAD, supple neck  Cardiovascular:  RRR without murmur or gallop. no peripheral edema Respiratory:  Good breath sounds bilaterally, CTAB with normal respiratory effort Skin:  Warm, no rashes Neurologic:   Mental status is normal. normal gait  Influenza negative  Assessment  1. Viral syndrome   2. Cough      Plan   Supportive care with fluids,cough med otc, advil for pain and fever control.   Follow up: Return if symptoms worsen or fail to improve.    Commons side effects, risks, benefits, and alternatives for medications and treatment plan prescribed today were discussed, and the patient expressed understanding of the given instructions. Patient is instructed to call or message via MyChart if he/she has any questions or concerns regarding our treatment plan. No barriers to understanding were identified. We discussed Red Flag symptoms and signs in detail. Patient expressed understanding regarding what to do in case of urgent or emergency type symptoms.   Medication list was reconciled, printed and provided to the patient in AVS. Patient  instructions and summary information was reviewed with the patient as documented in the AVS. This note was prepared with assistance of Dragon voice recognition software. Occasional wrong-word or sound-a-like substitutions may have occurred due to the inherent limitations of voice recognition software  Orders Placed This Encounter  Procedures  . POC Influenza A&B(BINAX/QUICKVUE)   No orders of the defined types were placed in this encounter.

## 2017-09-14 NOTE — Patient Instructions (Signed)
Viral Illness, Adult Viruses are tiny germs that can get into a person's body and cause illness. There are many different types of viruses, and they cause many types of illness. Viral illnesses can range from mild to severe. They can affect various parts of the body. Common illnesses that are caused by a virus include colds and the flu. Viral illnesses also include serious conditions such as HIV/AIDS (human immunodeficiency virus/acquired immunodeficiency syndrome). A few viruses have been linked to certain cancers. What are the causes? Many types of viruses can cause illness. Viruses invade cells in your body, multiply, and cause the infected cells to malfunction or die. When the cell dies, it releases more of the virus. When this happens, you develop symptoms of the illness, and the virus continues to spread to other cells. If the virus takes over the function of the cell, it can cause the cell to divide and grow out of control, as is the case when a virus causes cancer. Different viruses get into the body in different ways. You can get a virus by:  Swallowing food or water that is contaminated with the virus.  Breathing in droplets that have been coughed or sneezed into the air by an infected person.  Touching a surface that has been contaminated with the virus and then touching your eyes, nose, or mouth.  Being bitten by an insect or animal that carries the virus.  Having sexual contact with a person who is infected with the virus.  Being exposed to blood or fluids that contain the virus, either through an open cut or during a transfusion.  If a virus enters your body, your body's defense system (immune system) will try to fight the virus. You may be at higher risk for a viral illness if your immune system is weak. What are the signs or symptoms? Symptoms vary depending on the type of virus and the location of the cells that it invades. Common symptoms of the main types of viral illnesses  include: Cold and flu viruses  Fever.  Headache.  Sore throat.  Muscle aches.  Nasal congestion.  Cough. Digestive system (gastrointestinal) viruses  Fever.  Abdominal pain.  Nausea.  Diarrhea. Liver viruses (hepatitis)  Loss of appetite.  Tiredness.  Yellowing of the skin (jaundice). Brain and spinal cord viruses  Fever.  Headache.  Stiff neck.  Nausea and vomiting.  Confusion or sleepiness. Skin viruses  Warts.  Itching.  Rash. Sexually transmitted viruses  Discharge.  Swelling.  Redness.  Rash. How is this treated? Viruses can be difficult to treat because they live within cells. Antibiotic medicines do not treat viruses because these drugs do not get inside cells. Treatment for a viral illness may include:  Resting and drinking plenty of fluids.  Medicines to relieve symptoms. These can include over-the-counter medicine for pain and fever, medicines for cough or congestion, and medicines to relieve diarrhea.  Antiviral medicines. These drugs are available only for certain types of viruses. They may help reduce flu symptoms if taken early. There are also many antiviral medicines for hepatitis and HIV/AIDS.  Some viral illnesses can be prevented with vaccinations. A common example is the flu shot. Follow these instructions at home: Medicines   Take over-the-counter and prescription medicines only as told by your health care provider.  If you were prescribed an antiviral medicine, take it as told by your health care provider. Do not stop taking the medicine even if you start to feel better.  Be aware   of when antibiotics are needed and when they are not needed. Antibiotics do not treat viruses. If your health care provider thinks that you may have a bacterial infection as well as a viral infection, you may get an antibiotic. ? Do not ask for an antibiotic prescription if you have been diagnosed with a viral illness. That will not make your  illness go away faster. ? Frequently taking antibiotics when they are not needed can lead to antibiotic resistance. When this develops, the medicine no longer works against the bacteria that it normally fights. General instructions  Drink enough fluids to keep your urine clear or pale yellow.  Rest as much as possible.  Return to your normal activities as told by your health care provider. Ask your health care provider what activities are safe for you.  Keep all follow-up visits as told by your health care provider. This is important. How is this prevented? Take these actions to reduce your risk of viral infection:  Eat a healthy diet and get enough rest.  Wash your hands often with soap and water. This is especially important when you are in public places. If soap and water are not available, use hand sanitizer.  Avoid close contact with friends and family who have a viral illness.  If you travel to areas where viral gastrointestinal infection is common, avoid drinking water or eating raw food.  Keep your immunizations up to date. Get a flu shot every year as told by your health care provider.  Do not share toothbrushes, nail clippers, razors, or needles with other people.  Always practice safe sex.  Contact a health care provider if:  You have symptoms of a viral illness that do not go away.  Your symptoms come back after going away.  Your symptoms get worse. Get help right away if:  You have trouble breathing.  You have a severe headache or a stiff neck.  You have severe vomiting or abdominal pain. This information is not intended to replace advice given to you by your health care provider. Make sure you discuss any questions you have with your health care provider. Document Released: 11/19/2015 Document Revised: 12/22/2015 Document Reviewed: 11/19/2015 [employer]vier Interactive Patient Education  2018 [employer]  

## 2017-09-21 DIAGNOSIS — M25671 Stiffness of right ankle, not elsewhere classified: Secondary | ICD-10-CM | POA: Diagnosis not present

## 2017-09-21 DIAGNOSIS — M7661 Achilles tendinitis, right leg: Secondary | ICD-10-CM | POA: Diagnosis not present

## 2017-09-21 DIAGNOSIS — R2689 Other abnormalities of gait and mobility: Secondary | ICD-10-CM | POA: Diagnosis not present

## 2017-09-24 DIAGNOSIS — M25671 Stiffness of right ankle, not elsewhere classified: Secondary | ICD-10-CM | POA: Diagnosis not present

## 2017-09-24 DIAGNOSIS — M7661 Achilles tendinitis, right leg: Secondary | ICD-10-CM | POA: Diagnosis not present

## 2017-09-24 DIAGNOSIS — R2689 Other abnormalities of gait and mobility: Secondary | ICD-10-CM | POA: Diagnosis not present

## 2017-10-01 DIAGNOSIS — M25671 Stiffness of right ankle, not elsewhere classified: Secondary | ICD-10-CM | POA: Diagnosis not present

## 2017-10-01 DIAGNOSIS — M7661 Achilles tendinitis, right leg: Secondary | ICD-10-CM | POA: Diagnosis not present

## 2017-10-01 DIAGNOSIS — R2689 Other abnormalities of gait and mobility: Secondary | ICD-10-CM | POA: Diagnosis not present

## 2017-10-05 DIAGNOSIS — R2689 Other abnormalities of gait and mobility: Secondary | ICD-10-CM | POA: Diagnosis not present

## 2017-10-05 DIAGNOSIS — M7661 Achilles tendinitis, right leg: Secondary | ICD-10-CM | POA: Diagnosis not present

## 2017-10-05 DIAGNOSIS — M25671 Stiffness of right ankle, not elsewhere classified: Secondary | ICD-10-CM | POA: Diagnosis not present

## 2017-10-08 DIAGNOSIS — R2689 Other abnormalities of gait and mobility: Secondary | ICD-10-CM | POA: Diagnosis not present

## 2017-10-08 DIAGNOSIS — M7661 Achilles tendinitis, right leg: Secondary | ICD-10-CM | POA: Diagnosis not present

## 2017-10-08 DIAGNOSIS — M25671 Stiffness of right ankle, not elsewhere classified: Secondary | ICD-10-CM | POA: Diagnosis not present

## 2017-11-09 ENCOUNTER — Encounter: Payer: Self-pay | Admitting: Podiatry

## 2018-02-26 ENCOUNTER — Other Ambulatory Visit (HOSPITAL_COMMUNITY)
Admission: RE | Admit: 2018-02-26 | Discharge: 2018-02-26 | Disposition: A | Discharge status: Home / Self Care | Payer: 59 | Source: Ambulatory Visit | Attending: Obstetrics & Gynecology | Admitting: Obstetrics & Gynecology

## 2018-02-26 ENCOUNTER — Encounter: Payer: Self-pay | Admitting: Obstetrics & Gynecology

## 2018-02-26 ENCOUNTER — Encounter

## 2018-02-26 ENCOUNTER — Other Ambulatory Visit: Payer: Self-pay

## 2018-02-26 ENCOUNTER — Ambulatory Visit (INDEPENDENT_AMBULATORY_CARE_PROVIDER_SITE_OTHER): Payer: 59 | Admitting: Obstetrics & Gynecology

## 2018-02-26 VITALS — BP 136/86 | HR 84 | Resp 18 | Ht 61.5 in | Wt 232.0 lb

## 2018-02-26 DIAGNOSIS — Z124 Encounter for screening for malignant neoplasm of cervix: Secondary | ICD-10-CM | POA: Insufficient documentation

## 2018-02-26 DIAGNOSIS — Z0000 Encounter for general adult medical examination without abnormal findings: Secondary | ICD-10-CM | POA: Diagnosis not present

## 2018-02-26 DIAGNOSIS — Z01419 Encounter for gynecological examination (general) (routine) without abnormal findings: Secondary | ICD-10-CM | POA: Diagnosis not present

## 2018-02-26 NOTE — Progress Notes (Signed)
57 y.o. [id number] MarriedCaucasianF here for annual exam.  Doing well.  Children are doing well.  Oldest is in [place].  One of her twins is in [place].  He other twin is here in [place].  She has Crohn's and had a small bowel resection 3 weeks ago.  She is doing quite well now.    Denies vaginal bleeding.    Had issue with stones last year.  Urologist is Dr. Gilford Rile.    Patient's last menstrual period was 12/28/2010.          Sexually active: Yes.    The current method of family planning is post menopausal status.    Exercising: Yes.    cardio Smoker:  no  Health Maintenance: Pap:  09/10/15 neg. HR HPV:neg  History of abnormal Pap:  Yes, 1998   MMG:  05/15/17 BIRADS1:neg Colonoscopy:  2015 f/u 10 years  BMD:   Never TDaP:  2018 Pneumonia vaccine(s):  No Shingrix:   No Hep C testing: 12/08/16 neg  Screening Labs: here today - Not fasting    reports that she has never smoked. She has never used smokeless tobacco. She reports that she drinks about 3.0 oz of alcohol per week. She reports that she does not use drugs.  Past Medical History:  Diagnosis Date  . Constipation   . Environmental allergies   . Frequency of urination   . GERD (gastroesophageal reflux disease)    diet controlled -tx tums  . Hematuria   . Hiatal hernia   . History of gestational hypertension   . History of kidney stones   . History of pulmonary embolism 01/21/2011   bilateral -- RML, RLL, LLL  . OA (osteoarthritis)   . Pain in Achilles tendon    right  . Right ureteral stone   . Urgency of urination   . Uterine fibroid   . Wears glasses     Past Surgical History:  Procedure Laterality Date  . [place]  . COLONOSCOPY  11/12/2013  . CYSTOSCOPY/URETEROSCOPY/HOLMIUM LASER/STENT PLACEMENT Right 03/21/2017   Procedure: CYSTOSCOPY/ RETROGRADE/URETEROSCOPY/STENT PLACEMENT;  Surgeon: Ceasar Mons, MD;  Location: [facility];  Service: Urology;  Laterality: Right;  .  CYSTOSCOPY/URETEROSCOPY/HOLMIUM LASER/STENT PLACEMENT Right 03/30/2017   Procedure: CYSTOSCOPY/URETEROSCOPY/HOLMIUM LASER/STENT EXCHANGE;  Surgeon: Ceasar Mons, MD;  Location: [facility];  Service: Urology;  Laterality: Right;  . DILATATION & CURETTAGE/HYSTEROSCOPY WITH MYOSURE N/A 01/08/2017   Procedure: [place];  Surgeon: Megan Salon, MD;  Location: [facility];  Service: Gynecology;  Laterality: N/A;  . HOLMIUM LASER APPLICATION Right 08/24/3084   Procedure: HOLMIUM LASER APPLICATION;  Surgeon: Ceasar Mons, MD;  Location: [facility];  Service: Urology;  Laterality: Right;  . LAPAROSCOPIC CHOLECYSTECTOMY  1995  . STRABISMUS SURGERY Bilateral age 36  . TONSILLECTOMY  child  . TRANSTHORACIC ECHOCARDIOGRAM  02/07/2017   EF 55-60%  . WISDOM TOOTH EXTRACTION      Current Outpatient Medications  Medication Sig Dispense Refill  . Ascorbic Acid (VITAMIN C) 1000 MG tablet Take 1,000 mg by mouth daily.    . B Complex-C (SUPER B COMPLEX PO) Take 1 capsule by mouth daily.    . calcium carbonate (TUMS - DOSED IN MG ELEMENTAL CALCIUM) 500 MG chewable tablet Chew 3 tablets by mouth daily as needed for indigestion or heartburn.    . Calcium Carbonate-Vitamin D 600-200 MG-UNIT TABS Take by mouth.    . Cholecalciferol (VITAMIN  D3) 2000 units TABS Take 2,000 Int'l Units by mouth daily.    Marland Kitchen EVENING PRIMROSE OIL PO Take 1,300 mg by mouth daily.     . Ginkgo Biloba 100 MG CAPS Take by mouth.    Marland Kitchen MAGNESIUM CITRATE PO Take 400 mg by mouth at bedtime.    . Multiple Vitamin (MULTIVITAMIN) capsule Take by mouth.    . NONFORMULARY OR COMPOUNDED ITEM Vit E vaginal suppositories.  200U/ml.  One PV three times weekly. 36 each 3  . Omega 3-6-9 Fatty Acids (OMEGA-3-6-9 PO) Take 1,600 mg by mouth daily.    . Probiotic Product (PROBIOTIC PO) Take 1 capsule by mouth daily.    . vitamin B-12 (CYANOCOBALAMIN) 1000 MCG tablet Take  1,000 mcg by mouth daily.     No current facility-administered medications for this visit.     Family History  Problem Relation Age of Onset  . Diabetes Father   . Hypertension Father   . Deep vein thrombosis Father   . Congestive Heart Failure Father   . Lung cancer Mother   . Tuberculosis Mother        as a child  . Lymphoma Mother   . Non-Hodgkin's lymphoma Mother   . Skin cancer Maternal Grandmother   . Colon cancer Paternal Aunt        x2  . Heart attack Brother        half brother - 19  . Healthy Daughter   . Crohn's disease Daughter   . Allergies Daughter     Review of Systems  All other systems reviewed and are negative.   Exam:   BP 136/86 (BP Location: Right Arm, Patient Position: Sitting, Cuff Size: Large)   Pulse 84   Resp 18   Ht 5' 1.5" (1.562 m)   Wt 232 lb (105.2 kg)   LMP 12/28/2010   BMI 43.13 kg/m    Height: 5' 1.5" (156.2 cm)  Ht Readings from Last 3 Encounters:  02/26/18 5' 1.5" (1.562 m)  03/30/17 5\' 1"  (1.549 m)  03/21/17 5\' 1"  (1.549 m)    General appearance: alert, cooperative and appears stated age Head: Normocephalic, without obvious abnormality, atraumatic Neck: no adenopathy, supple, symmetrical, trachea midline and thyroid normal to inspection and palpation Lungs: clear to auscultation bilaterally Breasts: normal appearance, no masses or tenderness Heart: regular rate and rhythm Abdomen: soft, non-tender; bowel sounds normal; no masses,  no organomegaly Extremities: extremities normal, atraumatic, no cyanosis or edema Skin: Skin color, texture, turgor normal. No rashes or lesions Lymph nodes: Cervical, supraclavicular, and axillary nodes normal. No abnormal inguinal nodes palpated Neurologic: Grossly normal   Pelvic: External genitalia:  no lesions              Urethra:  normal appearing urethra with no masses, tenderness or lesions              Bartholins and Skenes: normal                 Vagina: normal appearing vagina  with normal color and discharge, no lesions              Cervix: no lesions              Pap taken: Yes.   Bimanual Exam:  Uterus:  normal size, contour, position, consistency, mobility, non-tender              Adnexa: normal adnexa and no mass, fullness, tenderness  Rectovaginal: Confirms               Anus:  normal sphincter tone, no lesions  Chaperone was present for exam.  A:  Well Woman with normal exam PMP,no HRT H/O uterine fibroids H/O nephrolithiasis H/O PE 7/12.  On coumadin x 1 year.  Now off.2 Vaginal atrophic changes/dysparuenia H/O colon cancer in two paternal aunts  P:   Mammogram guidelines reviewed pap smear obtained today CBC, CMP, TSH, Vit D, lipids obtained today Rx for shingrix vaccination given. return annually or prn

## 2018-02-27 LAB — COMPREHENSIVE METABOLIC PANEL
A/G RATIO: 1.7 (ref 1.2–2.2)
ALBUMIN: 4.4 g/dL (ref 3.5–5.5)
ALT: 18 IU/L (ref 0–32)
AST: 16 IU/L (ref 0–40)
Alkaline Phosphatase: 83 IU/L (ref 39–117)
BUN/Creatinine Ratio: 13 (ref 9–23)
BUN: 12 mg/dL (ref 6–24)
Bilirubin Total: <0.2 mg/dL (ref 0.0–1.2)
CO2: 24 mmol/L (ref 20–29)
Calcium: 9 mg/dL (ref 8.7–10.2)
Chloride: 103 mmol/L (ref 96–106)
Creatinine, Ser: 0.95 mg/dL (ref 0.57–1.00)
GFR, EST AFRICAN AMERICAN: 78 mL/min/{1.73_m2} (ref >59)
GFR, EST NON AFRICAN AMERICAN: 68 mL/min/{1.73_m2} (ref >59)
GLOBULIN, TOTAL: 2.6 g/dL (ref 1.5–4.5)
Glucose: 87 mg/dL (ref 65–99)
POTASSIUM: 3.9 mmol/L (ref 3.5–5.2)
SODIUM: 142 mmol/L (ref 134–144)
TOTAL PROTEIN: 7 g/dL (ref 6.0–8.5)

## 2018-02-27 LAB — LIPID PANEL
Chol/HDL Ratio: 2 ratio (ref 0.0–4.4)
Cholesterol, Total: 197 mg/dL (ref 100–199)
HDL: 99 mg/dL (ref >39)
LDL CALC: 76 mg/dL (ref 0–99)
Triglycerides: 108 mg/dL (ref 0–149)
VLDL Cholesterol Cal: 22 mg/dL (ref 5–40)

## 2018-02-27 LAB — CBC
Hematocrit: 44.1 % (ref 34.0–46.6)
Hemoglobin: 14.1 g/dL (ref 11.1–15.9)
MCH: 28.8 pg (ref 26.6–33.0)
MCHC: 32 g/dL (ref 31.5–35.7)
MCV: 90 fL (ref 79–97)
Platelets: 233 10*3/uL (ref 150–450)
RBC: 4.89 x10E6/uL (ref 3.77–5.28)
RDW: 13.7 % (ref 12.3–15.4)
WBC: 6.6 10*3/uL (ref 3.4–10.8)

## 2018-02-27 LAB — CYTOLOGY - PAP: Diagnosis: NEGATIVE

## 2018-02-27 LAB — TSH: TSH: 1.78 u[IU]/mL (ref 0.450–4.500)

## 2018-02-27 LAB — VITAMIN D 25 HYDROXY (VIT D DEFICIENCY, FRACTURES): Vit D, 25-Hydroxy: 36.6 ng/mL (ref 30.0–100.0)

## 2018-04-01 ENCOUNTER — Other Ambulatory Visit: Payer: Self-pay | Admitting: Obstetrics & Gynecology

## 2018-04-01 DIAGNOSIS — Z1231 Encounter for screening mammogram for malignant neoplasm of breast: Secondary | ICD-10-CM

## 2018-04-24 ENCOUNTER — Ambulatory Visit: Payer: Self-pay | Admitting: *Deleted

## 2018-04-24 NOTE — Telephone Encounter (Signed)
Pt reports upper abdominal pain, sub-sternal, "Right under breastbone". States Dx with hiatal hernia 12/2016 and "Feels  like that same pain, it's flaring up."  Onset Sunday night, gradual 5/10. Pain is intermittent but "Constant soreness."  States radiates "throughout breastbone" at times. Also reports bloating, 2 episodes of loose stools one Sunday and one Tuesday. Reports decreased appetite. Denies SOB, fever, nausea, vomiting. Has taken Tums which provide relief. Appt made for tomorrow AM with Daun Peacock per pt's time and date request. Care advise given per protocol.  Reason for Disposition . [1] MODERATE pain (e.g., interferes with normal activities) AND [2] pain comes and goes (cramps) AND [3] present > 24 hours  (Exception: pain with Vomiting or Diarrhea - see that Guideline)  Answer Assessment - Initial Assessment Questions 1. LOCATION: "Where does it hurt?"      Upper abdomen, sub-sternal 2. RADIATION: "Does the pain shoot anywhere else?" (e.g., chest, back)     Throughout breastbone 3. ONSET: "When did the pain begin?" (e.g., minutes, hours or days ago)      Sunday night 4. SUDDEN: "Gradual or sudden onset?"     Gradually; Had Sunday night, went away the reoccured last night 5. PATTERN "Does the pain come and go, or is it constant?"    - If constant: "Is it getting better, staying the same, or worsening?"      (Note: Constant means the pain never goes away completely; most serious pain is constant and it progresses)     - If intermittent: "How long does it last?" "Do you have pain now?"     (Note: Intermittent means the pain goes away completely between bouts)     Intermittent but constant soreness at area. 6. SEVERITY: "How bad is the pain?"  (e.g., Scale 1-10; mild, moderate, or severe)   - MILD (1-3): doesn't interfere with normal activities, abdomen soft and not tender to touch    - MODERATE (4-7): interferes with normal activities or awakens from sleep, tender to touch    - SEVERE  (8-10): excruciating pain, doubled over, unable to do any normal activities     5/10 intermittently, constant dull "Soreness." 7. RECURRENT SYMPTOM: "Have you ever had this type of abdominal pain before?" If so, ask: "When was the last time?" and "What happened that time?"      Yes, hiatal hernia 8. CAUSE: "What do you think is causing the abdominal pain?"     Hiatal hernia 9. RELIEVING/AGGRAVATING FACTORS: "What makes it better or worse?" (e.g., movement, antacids, bowel movement)     Tums, effective 10. OTHER SYMPTOMS: "Has there been any vomiting, diarrhea, constipation, or urine problems?"      Bloating, onset Sunday night; 2 episodes of loose stools, 1 Sunday, 1 yesterday.  Poor appetite.  Protocols used: ABDOMINAL PAIN [facility]

## 2018-04-25 ENCOUNTER — Encounter: Payer: Self-pay | Admitting: Physician Assistant

## 2018-04-25 ENCOUNTER — Ambulatory Visit (INDEPENDENT_AMBULATORY_CARE_PROVIDER_SITE_OTHER): Payer: 59 | Admitting: Physician Assistant

## 2018-04-25 ENCOUNTER — Other Ambulatory Visit: Payer: Self-pay

## 2018-04-25 VITALS — BP 118/72 | HR 70 | Temp 97.8°F | Resp 14 | Ht 62.0 in | Wt 230.0 lb

## 2018-04-25 DIAGNOSIS — K2900 Acute gastritis without bleeding: Secondary | ICD-10-CM | POA: Diagnosis not present

## 2018-04-25 MED ORDER — PANTOPRAZOLE SODIUM 40 MG PO TBEC: 40.0000 mg | DELAYED_RELEASE_TABLET | Freq: Every day | ORAL | 3 refills | Status: DC

## 2018-04-25 NOTE — Progress Notes (Signed)
Patient with history of hiatal hernia and GERD presents to clinic today c/o 4 days of reflux associated with bloating and epigastric pain. First noted on waking Sunday morning. Notes some loose caliber of stool. Denies melena, hematochezia or tenesmus. Denies change in diet. Denies abnormal food or water source. Denies sick contact. Has had some mild residual nausea without vomiting. Pain 2-3/10 currently. Much improved form previous days.   Past Medical History:  Diagnosis Date  . Constipation   . Environmental allergies   . Frequency of urination   . GERD (gastroesophageal reflux disease)    diet controlled -tx tums  . Hematuria   . Hiatal hernia   . History of gestational hypertension   . History of kidney stones   . History of pulmonary embolism 01/21/2011   bilateral -- RML, RLL, LLL  . OA (osteoarthritis)   . Pain in Achilles tendon    right  . Right ureteral stone   . Urgency of urination   . Uterine fibroid   . Wears glasses     Current Outpatient Medications on File Prior to Visit  Medication Sig Dispense Refill  . Ascorbic Acid (VITAMIN C) 1000 MG tablet Take 1,000 mg by mouth daily.    . B Complex-C (SUPER B COMPLEX PO) Take 1 capsule by mouth daily.    . calcium carbonate (TUMS - DOSED IN MG ELEMENTAL CALCIUM) 500 MG chewable tablet Chew 3 tablets by mouth daily as needed for indigestion or heartburn.    . Calcium Carbonate-Vitamin D 600-200 MG-UNIT TABS Take by mouth.    . Cholecalciferol (VITAMIN D3) 2000 units TABS Take 2,000 Int'l Units by mouth daily.    Marland Kitchen EVENING PRIMROSE OIL PO Take 1,300 mg by mouth daily.     . Ginkgo Biloba 100 MG CAPS Take by mouth.    Marland Kitchen MAGNESIUM CITRATE PO Take 400 mg by mouth at bedtime.    . Multiple Vitamin (MULTIVITAMIN) capsule Take by mouth.    . NONFORMULARY OR COMPOUNDED ITEM Vit E vaginal suppositories.  200U/ml.  One PV three times weekly. 36 each 3  . Omega 3-6-9 Fatty Acids (OMEGA-3-6-9 PO) Take 1,600 mg by mouth daily.      . Probiotic Product (PROBIOTIC PO) Take 1 capsule by mouth daily.    . vitamin B-12 (CYANOCOBALAMIN) 1000 MCG tablet Take 1,000 mcg by mouth daily.     No current facility-administered medications on file prior to visit.     Allergies  Allergen Reactions  . Ciprofloxacin Hcl Other (See Comments)    Agitation   . Codeine Other (See Comments)    agitation  . Dilaudid [Hydromorphone Hcl] Other (See Comments)    Agitation   . Morphine And Related Other (See Comments)    Agitation     Family History  Problem Relation Age of Onset  . Diabetes Father   . Hypertension Father   . Deep vein thrombosis Father   . Congestive Heart Failure Father   . Lung cancer Mother   . Tuberculosis Mother        as a child  . Lymphoma Mother   . Non-Hodgkin's lymphoma Mother   . Skin cancer Maternal Grandmother   . Colon cancer Paternal Aunt        x2  . Heart attack Brother        half brother - 36  . Healthy Daughter   . Crohn's disease Daughter   . Allergies Daughter     Social History  Socioeconomic History  . Marital status: Married    Spouse name: Jenny Reichmann  . Number of children: 3  . Years of education: Not on file  . Highest education level: Not on file  Occupational History  . Not on file  Social Needs  . Financial resource strain: Not on file  . Food insecurity:    Worry: Not on file    Inability: Not on file  . Transportation needs:    Medical: Not on file    Non-medical: Not on file  Tobacco Use  . Smoking status: Never Smoker  . Smokeless tobacco: Never Used  Substance and Sexual Activity  . Alcohol use: Yes    Alcohol/week: 5.0 standard drinks    Types: 5 Glasses of wine per week  . Drug use: No  . Sexual activity: Yes    Partners: Male    Birth control/protection: Post-menopausal  Lifestyle  . Physical activity:    Days per week: Not on file    Minutes per session: Not on file  . Stress: Not on file  Relationships  . Social connections:    Talks on  phone: Not on file    Gets together: Not on file    Attends religious service: Not on file    Active member of club or organization: Not on file    Attends meetings of clubs or organizations: Not on file    Relationship status: Not on file  Other Topics Concern  . Not on file  Social History Narrative   Originally from [place] -- Moved here 16 years ago.    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   Review of Systems - See HPI.  All other ROS are negative.  BP 118/72   Pulse 70   Temp 97.8 F (36.6 C) (Oral)   Resp 14   Ht 5\' 2"  (1.575 m)   Wt 230 lb (104.3 kg)   LMP 12/28/2010   SpO2 99%   BMI 42.07 kg/m   Physical Exam  Constitutional: She appears well-developed and well-nourished.  HENT:  Head: Normocephalic and atraumatic.  Mouth/Throat: Oropharynx is clear and moist.  Eyes: Pupils are equal, round, and reactive to light.  Cardiovascular: Normal rate and regular rhythm.  Pulmonary/Chest: Breath sounds normal.  Abdominal: Normal appearance and bowel sounds are normal. There is tenderness in the epigastric area and left upper quadrant.  Psychiatric: She has a normal mood and affect.  Vitals reviewed.   Recent Results (from the past 2160 hour(s))  Cytology - PAP     Status: None   Collection Time: 02/26/18 12:00 AM  Result Value Ref Range   Adequacy      Satisfactory for evaluation  endocervical/transformation zone component PRESENT.   Diagnosis      NEGATIVE FOR INTRAEPITHELIAL LESIONS OR MALIGNANCY.   Material Submitted CervicoVaginal Pap [ThinPrep Imaged]    CYTOLOGY - PAP PAP RESULT   CBC     Status: None   Collection Time: 02/26/18  2:46 PM  Result Value Ref Range   WBC 6.6 3.4 - 10.8 x10E3/uL   RBC 4.89 3.77 - 5.28 x10E6/uL   Hemoglobin 14.1 11.1 - 15.9 g/dL   Hematocrit 44.1 34.0 - 46.6 %   MCV 90 79 - 97 fL   MCH 28.8 26.6 - 33.0 pg   MCHC 32.0 31.5 - 35.7 g/dL   RDW 13.7 12.3 - 15.4 %   Platelets 233 150 - 450  x10E3/uL  Comprehensive metabolic panel     Status: None   Collection Time: 02/26/18  2:46 PM  Result Value Ref Range   Glucose 87 65 - 99 mg/dL   BUN 12 6 - 24 mg/dL   Creatinine, Ser 0.95 0.57 - 1.00 mg/dL   GFR calc non Af Amer 68 >59 mL/min/1.73   GFR calc Af Amer 78 >59 mL/min/1.73   BUN/Creatinine Ratio 13 9 - 23   Sodium 142 134 - 144 mmol/L   Potassium 3.9 3.5 - 5.2 mmol/L   Chloride 103 96 - 106 mmol/L   CO2 24 20 - 29 mmol/L   Calcium 9.0 8.7 - 10.2 mg/dL   Total Protein 7.0 6.0 - 8.5 g/dL   Albumin 4.4 3.5 - 5.5 g/dL   Globulin, Total 2.6 1.5 - 4.5 g/dL   Albumin/Globulin Ratio 1.7 1.2 - 2.2   Bilirubin Total <0.2 0.0 - 1.2 mg/dL   Alkaline Phosphatase 83 39 - 117 IU/L   AST 16 0 - 40 IU/L   ALT 18 0 - 32 IU/L  Lipid panel     Status: None   Collection Time: 02/26/18  2:46 PM  Result Value Ref Range   Cholesterol, Total 197 100 - 199 mg/dL   Triglycerides 108 0 - 149 mg/dL   HDL 99 >39 mg/dL   VLDL Cholesterol Cal 22 5 - 40 mg/dL   LDL Calculated 76 0 - 99 mg/dL   Chol/HDL Ratio 2.0 0.0 - 4.4 ratio    Comment:                                   T. Chol/HDL Ratio                                             Men  Women                               1/2 Avg.Risk  3.4    3.3                                   Avg.Risk  5.0    4.4                                2X Avg.Risk  9.6    7.1                                3X Avg.Risk 23.4   11.0   TSH     Status: None   Collection Time: 02/26/18  2:46 PM  Result Value Ref Range   TSH 1.780 0.450 - 4.500 uIU/mL  VITAMIN D 25 Hydroxy (Vit-D Deficiency, Fractures)     Status: None   Collection Time: 02/26/18  2:46 PM  Result Value Ref Range   Vit D, 25-Hydroxy 36.6 30.0 - 100.0 ng/mL    Comment: Vitamin D deficiency has been defined by the [place] practice guideline as a level of serum 25-OH vitamin D less than 20 ng/mL (1,2). The  Endocrine Society went on to further define vitamin  D insufficiency as a level between 21 and 29 ng/mL (2). 1. IOM (Institute of Medicine). 2010. Dietary reference    intakes for calcium and D. [place]: The    [employer]. 2. Holick MF, [place], Bischoff-Ferrari HA, et al.    Evaluation, treatment, and prevention of vitamin D    deficiency: an Endocrine Society clinical practice    guideline. JCEM. 2011 Jul; 96(7):1911-30.    Assessment/Plan: 1. Acute gastritis without hemorrhage, unspecified gastritis type Mild. Improved since onset. Start Protonix and GERD diet. Tylenol if needed for pain. Follow-up if not continuing to improve/resolve. - pantoprazole (PROTONIX) 40 MG tablet; Take 1 tablet (40 mg total) by mouth daily.  Dispense: 30 tablet; Refill: 3   Leeanne Rio, [place]

## 2018-04-25 NOTE — Patient Instructions (Signed)
Please keep well-hydrated. Limit late night eating and heavy foods.   Start the Protonix taking as directed for the next 10-14 days to help calm things down. Follow the dietary recommendations below.  Follow-up if symptoms are not continuing to improve/resolve or if any new symptoms develop.   Food Choices for Gastroesophageal Reflux Disease, Adult When you have gastroesophageal reflux disease (GERD), the foods you eat and your eating habits are very important. Choosing the right foods can help ease your discomfort. What guidelines do I need to follow?  Choose fruits, vegetables, whole grains, and low-fat dairy products.  Choose low-fat meat, fish, and poultry.  Limit fats such as oils, salad dressings, butter, nuts, and avocado.  Keep a food diary. This helps you identify foods that cause symptoms.  Avoid foods that cause symptoms. These may be different for everyone.  Eat small meals often instead of 3 large meals a day.  Eat your meals slowly, in a place where you are relaxed.  Limit fried foods.  Cook foods using methods other than frying.  Avoid drinking alcohol.  Avoid drinking large amounts of liquids with your meals.  Avoid bending over or lying down until 2-3 hours after eating. What foods are not recommended? These are some foods and drinks that may make your symptoms worse: Vegetables Tomatoes. Tomato juice. Tomato and spaghetti sauce. Chili peppers. Onion and garlic. Horseradish. Fruits Oranges, grapefruit, and lemon (fruit and juice). Meats High-fat meats, fish, and poultry. This includes hot dogs, ribs, ham, sausage, salami, and bacon. Dairy Whole milk and chocolate milk. Sour cream. Cream. Butter. Ice cream. Cream cheese. Drinks Coffee and tea. Bubbly (carbonated) drinks or energy drinks. Condiments Hot sauce. Barbecue sauce. Sweets/Desserts Chocolate and cocoa. Donuts. Peppermint and spearmint. Fats and Oils High-fat foods. This includes [place]  fries and potato chips. Other Vinegar. Strong spices. This includes black pepper, white pepper, red pepper, cayenne, curry powder, cloves, ginger, and chili powder. The items listed above may not be a complete list of foods and drinks to avoid. Contact your dietitian for more information. This information is not intended to replace advice given to you by your health care provider. Make sure you discuss any questions you have with your health care provider. Document Released: 01/09/2012 Document Revised: 12/16/2015 Document Reviewed: 05/14/2013 Elsevier Interactive Patient Education  2017 [employer].

## 2018-05-20 ENCOUNTER — Ambulatory Visit: Payer: 59

## 2018-05-20 ENCOUNTER — Encounter: Payer: Self-pay | Admitting: Obstetrics & Gynecology

## 2018-05-20 ENCOUNTER — Telehealth: Payer: Self-pay | Admitting: Obstetrics & Gynecology

## 2018-05-20 NOTE — Telephone Encounter (Signed)
Patient sent the following correspondence through [place]. Routing to triage to assist patient with request.  Wanting to discuss and/or advice regarding any other possible treatment options for vaginal atrophy.

## 2018-05-20 NOTE — Telephone Encounter (Signed)
Return call to patient.  Office visit recommended for patient.  Hx PE and Vaginal Atrophy/Dsypareunia.  Would like to discuss her options for care with Dr. Sabra Heck.  Office visit 05/27/18 with Dr. Sabra Heck scheduled.  Encounter to Dr. Sabra Heck and will close.

## 2018-05-22 ENCOUNTER — Encounter: Payer: Self-pay | Admitting: Obstetrics & Gynecology

## 2018-05-22 ENCOUNTER — Telehealth: Payer: Self-pay | Admitting: Obstetrics & Gynecology

## 2018-05-22 NOTE — Telephone Encounter (Signed)
Spoke with patient after previous triage RN. Advised I had spoke with Dr. Sabra Heck earlier this morning and received recommendations from Dr. Sabra Heck.  Pt declines alprazolam Rx at this time. Again, states she feels okay until she sees Dr. Sabra Heck tomorrow.

## 2018-05-22 NOTE — Telephone Encounter (Signed)
Spoke with patient. Patient requesting OV to discuss anxiety medication. Is currently experiencing "situation in marriage, may need medication. Would prefer to discuss with Dr. Sabra Heck". Patient previously scheduled for 11/4 to discuss vaginal atrophy, OV moved to 10/31 at 11:15am.   RN asked patient if she is having any SI/HI, patient states "kind of". Patient denies having a plan. Patient states "Im ok to talk with someone". Patient states she feels safe.   Provided patient with National Suicide Prevention Lifeline at [phone] .ER precautions provided for worsening symptoms.   Routing to provider for final review. Patient is agreeable to disposition. Will close encounter.

## 2018-05-22 NOTE — Telephone Encounter (Signed)
Patient sent the following correspondence through [place]. Routing to triage to assist patient with request.  Hi. I have an appointment on Monday and in addition to discussing my health issue, I believe I need an anti anxiety med. I hoped it could wait until Monday, but I think I will need that sooner. I am having a personal crisis and feel most comfortable discussing with Dr. Sabra Heck. Thank you.

## 2018-05-22 NOTE — Addendum Note (Signed)
Addended by: Michele Mcalpine on: 05/22/2018 11:20 AM   Modules accepted: Orders

## 2018-05-23 ENCOUNTER — Ambulatory Visit (INDEPENDENT_AMBULATORY_CARE_PROVIDER_SITE_OTHER): Payer: 59 | Admitting: Obstetrics & Gynecology

## 2018-05-23 ENCOUNTER — Other Ambulatory Visit: Payer: Self-pay

## 2018-05-23 ENCOUNTER — Encounter: Payer: Self-pay | Admitting: Obstetrics & Gynecology

## 2018-05-23 VITALS — BP 110/78 | HR 76 | Resp 16 | Ht 62.0 in | Wt 225.0 lb

## 2018-05-23 DIAGNOSIS — Z639 Problem related to primary support group, unspecified: Secondary | ICD-10-CM

## 2018-05-23 DIAGNOSIS — F419 Anxiety disorder, unspecified: Secondary | ICD-10-CM | POA: Diagnosis not present

## 2018-05-23 DIAGNOSIS — N941 Unspecified dyspareunia: Secondary | ICD-10-CM

## 2018-05-23 MED ORDER — ALPRAZOLAM 0.5 MG PO TABS: 0.5000 mg | ORAL_TABLET | Freq: Every evening | ORAL | 0 refills | Status: DC | PRN

## 2018-05-23 MED ORDER — PAROXETINE HCL 10 MG PO TABS: 10.0000 mg | ORAL_TABLET | ORAL | 2 refills | Status: DC

## 2018-05-23 NOTE — Progress Notes (Signed)
GYNECOLOGY  VISIT  CC:   dysparuenia  HPI: 57 y.o. G61P2003 Married White or Caucasian female here for several issues today.  This week husband asked her if she wanted to separate from each other (after recently returning from a really good family trip that also involved their three daughters and significant others).  This came out of the blue for pt.  With further discussion, pt has learned he has engaged in an extramarital relationship.  This is now ended and he does not want to end the marriage.  However, this has brought up many issues including trust and relationship issues.  Pt has significant vaginal atrophic changes due to menopause.  Has hx of PE.  Cannot take any estrogens.  Reviewed with pt just how many medications this eliminates for her.  She has tried Vit E but this causes a burning sensation.  Other topical treatments reviewed.  She is willing to try coconut oil and will let me know if this causes burning as well.  Josph Macho touch as well as cost discussed.  She is aware I would need to refer her.  She will need to discuss with spouse first.   Denies need for STD testing today as has not been SA recently.  Therapy/counseling discussed.  Feel sex therapy could be beneficial as well as possibly he feels this is part of the cause of the relationship issues.  Names given.  She will need to discuss with him options.    Lastly, she has developed anxiety the last few days as a result of this revelation.  Having trouble sleeping at night.  Has shared this with a work Social worker.  Feels she would benefit from treatment.  Options discussed with pt.  SSRIs as well as benzodiazepines discussed.  Risks and benefits discussed.    GYNECOLOGIC HISTORY: Patient's last menstrual period was 12/28/2010. Contraception: post menopausal  Menopausal hormone therapy: none  Patient Active Problem List   Diagnosis Date Noted  . Tendonitis, Achilles, right 01/23/2017  . Cardiomegaly 01/23/2017  . Healed or  old pulmonary embolism 10/31/2013  . Family history of colon cancer 10/31/2013    Past Medical History:  Diagnosis Date  . Constipation   . Environmental allergies   . Frequency of urination   . GERD (gastroesophageal reflux disease)    diet controlled -tx tums  . Hematuria   . Hiatal hernia   . History of gestational hypertension   . History of kidney stones   . History of pulmonary embolism 01/21/2011   bilateral -- RML, RLL, LLL  . OA (osteoarthritis)   . Pain in Achilles tendon    right  . Right ureteral stone   . Urgency of urination   . Uterine fibroid   . Wears glasses     Past Surgical History:  Procedure Laterality Date  . [place]  . COLONOSCOPY  11/12/2013  . CYSTOSCOPY/URETEROSCOPY/HOLMIUM LASER/STENT PLACEMENT Right 03/21/2017   Procedure: CYSTOSCOPY/ RETROGRADE/URETEROSCOPY/STENT PLACEMENT;  Surgeon: Ceasar Mons, MD;  Location: [facility];  Service: Urology;  Laterality: Right;  . CYSTOSCOPY/URETEROSCOPY/HOLMIUM LASER/STENT PLACEMENT Right 03/30/2017   Procedure: CYSTOSCOPY/URETEROSCOPY/HOLMIUM LASER/STENT EXCHANGE;  Surgeon: Ceasar Mons, MD;  Location: [facility];  Service: Urology;  Laterality: Right;  . DILATATION & CURETTAGE/HYSTEROSCOPY WITH MYOSURE N/A 01/08/2017   Procedure: [place];  Surgeon: Megan Salon, MD;  Location: [facility];  Service: Gynecology;  Laterality: N/A;  . HOLMIUM LASER APPLICATION Right  03/30/2017   Procedure: HOLMIUM LASER APPLICATION;  Surgeon: Ceasar Mons, MD;  Location: [facility];  Service: Urology;  Laterality: Right;  . LAPAROSCOPIC CHOLECYSTECTOMY  1995  . STRABISMUS SURGERY Bilateral age 30  . TONSILLECTOMY  child  . TRANSTHORACIC ECHOCARDIOGRAM  02/07/2017   EF 55-60%  . WISDOM TOOTH EXTRACTION      MEDS:   Current Outpatient Medications on File Prior to Visit  Medication Sig  Dispense Refill  . Ascorbic Acid (VITAMIN C) 1000 MG tablet Take 1,000 mg by mouth daily.    . B Complex-C (SUPER B COMPLEX PO) Take 1 capsule by mouth daily.    . calcium carbonate (TUMS - DOSED IN MG ELEMENTAL CALCIUM) 500 MG chewable tablet Chew 3 tablets by mouth daily as needed for indigestion or heartburn.    . Calcium Carbonate-Vitamin D 600-200 MG-UNIT TABS Take by mouth.    . Cholecalciferol (VITAMIN D3) 2000 units TABS Take 2,000 Int'l Units by mouth daily.    Marland Kitchen EVENING PRIMROSE OIL PO Take 1,300 mg by mouth daily.     . Ginkgo Biloba 100 MG CAPS Take by mouth.    Marland Kitchen MAGNESIUM CITRATE PO Take 400 mg by mouth at bedtime.    . Multiple Vitamin (MULTIVITAMIN) capsule Take by mouth.    Ernestine Conrad 3-6-9 Fatty Acids (OMEGA-3-6-9 PO) Take 1,600 mg by mouth daily.    . Probiotic Product (PROBIOTIC PO) Take 1 capsule by mouth daily.    . vitamin B-12 (CYANOCOBALAMIN) 1000 MCG tablet Take 1,000 mcg by mouth daily.     No current facility-administered medications on file prior to visit.     ALLERGIES: Ciprofloxacin hcl; Codeine; Dilaudid [hydromorphone hcl]; and Morphine and related  Family History  Problem Relation Age of Onset  . Diabetes Father   . Hypertension Father   . Deep vein thrombosis Father   . Congestive Heart Failure Father   . Lung cancer Mother   . Tuberculosis Mother        as a child  . Lymphoma Mother   . Non-Hodgkin's lymphoma Mother   . Skin cancer Maternal Grandmother   . Colon cancer Paternal Aunt        x2  . Heart attack Brother        half brother - 45  . Healthy Daughter   . Crohn's disease Daughter   . Allergies Daughter     SH:  Married, non smoker  Review of Systems  Cardiovascular: Positive for chest pain.  Genitourinary: Positive for dyspareunia.       Loss of sexual interest   Psychiatric/Behavioral: The patient is nervous/anxious.   All other systems reviewed and are negative.   PHYSICAL EXAMINATION:    BP 110/78 (BP Location: Right  Arm, Patient Position: Sitting, Cuff Size: Large)   Pulse 76   Resp 16   Ht 5\' 2"  (1.575 m)   Wt 225 lb (102.1 kg)   LMP 12/28/2010   BMI 41.15 kg/m     General appearance: alert, cooperative and appears stated age Lymph:  no inguinal LAD noted  Pelvic: External genitalia:  no lesions              Urethra:  normal appearing urethra with no masses, tenderness or lesions              Bartholins and Skenes: normal                 Vagina: significant atrophy noted, no  lesions or discharge noted              Assessment: Vaginal atrophic changes due to menopause Dyspareunia H/o PE and cannot use estrogen products Marital stressors Anxiety  Plan: Names for therapists given Trial of coconut oil initiated.  Pt will consider Josph Macho touch and let me know if referral needed. Will start Paxil 10mg  daily.  Pt to give update in 10-14 days.  Also, rx given for xanax 0.5mg  to use 1/2 to 1 tab prn. #30/0RF   ~25 minutes spent with patient >50% of time was in face to face discussion of above.

## 2018-05-24 ENCOUNTER — Encounter: Payer: Self-pay | Admitting: Obstetrics & Gynecology

## 2018-05-24 ENCOUNTER — Telehealth: Payer: Self-pay | Admitting: Obstetrics & Gynecology

## 2018-05-24 NOTE — Telephone Encounter (Signed)
Patient sent the following message through [place]. Routing to triage to assist patient with request.   I understand the importance of the anti-depressant. I have had a great deal of anxiety today. Would an alternative med work the same as Paxil? I read something about Wellbutrin.

## 2018-05-24 NOTE — Telephone Encounter (Signed)
Mych[place] message routed to Dr. Sabra Heck directly per Dr. Sabra Heck.

## 2018-05-26 ENCOUNTER — Encounter: Payer: Self-pay | Admitting: Obstetrics & Gynecology

## 2018-05-26 ENCOUNTER — Other Ambulatory Visit: Payer: Self-pay | Admitting: Obstetrics & Gynecology

## 2018-05-26 MED ORDER — FLUOXETINE HCL 10 MG PO TABS: 10.0000 mg | ORAL_TABLET | Freq: Every day | ORAL | 0 refills | Status: DC

## 2018-05-26 NOTE — Progress Notes (Signed)
Order for prozac 10mg  placed.

## 2018-05-27 ENCOUNTER — Ambulatory Visit: Payer: Self-pay | Admitting: Obstetrics & Gynecology

## 2018-05-27 ENCOUNTER — Encounter: Payer: Self-pay | Admitting: Obstetrics & Gynecology

## 2018-05-28 ENCOUNTER — Encounter: Payer: Self-pay | Admitting: Physician Assistant

## 2018-05-31 ENCOUNTER — Ambulatory Visit: Payer: 59 | Admitting: Psychology

## 2018-06-03 DIAGNOSIS — N200 Calculus of kidney: Secondary | ICD-10-CM | POA: Diagnosis not present

## 2018-06-03 DIAGNOSIS — R8271 Bacteriuria: Secondary | ICD-10-CM | POA: Diagnosis not present

## 2018-06-04 ENCOUNTER — Encounter: Payer: Self-pay | Admitting: Obstetrics & Gynecology

## 2018-06-09 ENCOUNTER — Encounter: Payer: Self-pay | Admitting: Obstetrics & Gynecology

## 2018-06-10 ENCOUNTER — Telehealth: Payer: Self-pay | Admitting: Obstetrics & Gynecology

## 2018-06-10 NOTE — Telephone Encounter (Signed)
Patient sent the following correspondence through [place]. Routing to review and close encounter as needed..  Hello! I will try taking the Prozac in the evening. Thank you, and have a good week.     ----- Message -----  From: Megan Salon, MD  Sent: 06/09/18, 11:02 PM  To: Yasmeen A Schwoerer  Subject: RE: Visit Follow-Up Question    Thanks for the message. I know these tablets are small and you are splitting them. The peak action of the medication (and when you will have the most side effects) is 6-8 hours after you take them. If you want to try to take it with dinner, that may decrease the side effects. If this did make a difference, you could then considering increasing to 10mg . Just a thought. I think the psychologist is a good choice as well. Please let me know what you think.    Edwinna Areola    ----- Message -----   From: Elon Alas   Sent: 06/09/2018 9:05 AM EST    To: Megan Salon, MD  Subject: Visit Follow-Up Question    Hi! I wanted to let you know that I believe the Prozac is helping. I am only taking half a tablet (5 mg), as the full dose made me extremely sleepy.     Also, I haven't needed the Xanax every day -- only occasionally to help me sleep. The anxiety is better and I am seeing a psychologist. My husband and I are trying to make things work. Thank you for your help.

## 2018-06-18 ENCOUNTER — Other Ambulatory Visit: Payer: Self-pay | Admitting: Obstetrics & Gynecology

## 2018-06-18 NOTE — Telephone Encounter (Signed)
Medication refill request: Fluoxetine 10 mg daily Last AEX:  02/26/2018 Next AEX: 06/27/2019 Last MMG (if hormonal medication request): N/A Refill authorized: 05/26/2018 #30 0RF given  Please advise.

## 2018-06-28 ENCOUNTER — Ambulatory Visit
Admission: RE | Admit: 2018-06-28 | Discharge: 2018-06-28 | Disposition: A | Discharge status: Home / Self Care | Payer: 59 | Source: Ambulatory Visit | Attending: Obstetrics & Gynecology | Admitting: Obstetrics & Gynecology

## 2018-06-28 DIAGNOSIS — Z1231 Encounter for screening mammogram for malignant neoplasm of breast: Secondary | ICD-10-CM

## 2018-07-19 ENCOUNTER — Encounter: Payer: Self-pay | Admitting: Obstetrics & Gynecology

## 2018-07-19 MED ORDER — FLUOXETINE HCL 10 MG PO TABS: 10.0000 mg | ORAL_TABLET | Freq: Every day | ORAL | 5 refills | Status: DC

## 2018-07-30 ENCOUNTER — Other Ambulatory Visit: Payer: Self-pay

## 2018-07-30 ENCOUNTER — Ambulatory Visit (INDEPENDENT_AMBULATORY_CARE_PROVIDER_SITE_OTHER): Payer: 59 | Admitting: Physician Assistant

## 2018-07-30 ENCOUNTER — Encounter: Payer: Self-pay | Admitting: Physician Assistant

## 2018-07-30 VITALS — BP 122/70 | HR 69 | Temp 98.4°F | Resp 14 | Ht 62.0 in | Wt 217.0 lb

## 2018-07-30 DIAGNOSIS — R222 Localized swelling, mass and lump, trunk: Secondary | ICD-10-CM

## 2018-07-30 NOTE — Progress Notes (Signed)
Patient presents to clinic today c/o knot of R supraclavicular area noted over the past 1.5-2 months. Notes the area has gotten bigger since first noting. Denies trauma or injury prior to development. Denies pain, redness, tenderness or drainage from the area. Denies fever, chills, nausea or vomiting. Denies unexplained weight loss. .   Past Medical History:  Diagnosis Date  . Constipation   . Environmental allergies   . Frequency of urination   . GERD (gastroesophageal reflux disease)    diet controlled -tx tums  . Hematuria   . Hiatal hernia   . History of gestational hypertension   . History of kidney stones   . History of pulmonary embolism 01/21/2011   bilateral -- RML, RLL, LLL  . OA (osteoarthritis)   . Pain in Achilles tendon    right  . Right ureteral stone   . Urgency of urination   . Uterine fibroid   . Wears glasses     Current Outpatient Medications on File Prior to Visit  Medication Sig Dispense Refill  . ALPRAZolam (XANAX) 0.5 MG tablet Take 1 tablet (0.5 mg total) by mouth at bedtime as needed for anxiety. 30 tablet 0  . Ascorbic Acid (VITAMIN C) 1000 MG tablet Take 1,000 mg by mouth daily.    . B Complex-C (SUPER B COMPLEX PO) Take 1 capsule by mouth daily.    . calcium carbonate (TUMS - DOSED IN MG ELEMENTAL CALCIUM) 500 MG chewable tablet Chew 3 tablets by mouth daily as needed for indigestion or heartburn.    . Cholecalciferol (VITAMIN D3) 2000 units TABS Take 2,000 Int'l Units by mouth daily.    Marland Kitchen EVENING PRIMROSE OIL PO Take 1,300 mg by mouth daily.     Marland Kitchen FLUoxetine (PROZAC) 10 MG tablet Take 1 tablet (10 mg total) by mouth daily. (Patient taking differently: Take 5 mg by mouth daily. ) 30 tablet 5  . Ginkgo Biloba 100 MG CAPS Take by mouth.    Marland Kitchen MAGNESIUM CITRATE PO Take 400 mg by mouth at bedtime.    . Multiple Vitamin (MULTIVITAMIN) capsule Take by mouth.    Ernestine Conrad 3-6-9 Fatty Acids (OMEGA-3-6-9 PO) Take 1,600 mg by mouth daily.    . Probiotic  Product (PROBIOTIC PO) Take 1 capsule by mouth daily.     No current facility-administered medications on file prior to visit.     Allergies  Allergen Reactions  . Ciprofloxacin Hcl Other (See Comments)    Agitation   . Codeine Other (See Comments)    agitation  . Dilaudid [Hydromorphone Hcl] Other (See Comments)    Agitation   . Morphine And Related Other (See Comments)    Agitation     Family History  Problem Relation Age of Onset  . Diabetes Father   . Hypertension Father   . Deep vein thrombosis Father   . Congestive Heart Failure Father   . Lung cancer Mother   . Tuberculosis Mother        as a child  . Lymphoma Mother   . Non-Hodgkin's lymphoma Mother   . Skin cancer Maternal Grandmother   . Colon cancer Paternal Aunt        x2  . Heart attack Brother        half brother - 35  . Healthy Daughter   . Crohn's disease Daughter   . Allergies Daughter   . Breast cancer Neg Hx     Social History   Socioeconomic History  . Marital  status: Married    Spouse name: Jenny Reichmann  . Number of children: 3  . Years of education: Not on file  . Highest education level: Not on file  Occupational History  . Not on file  Social Needs  . Financial resource strain: Not on file  . Food insecurity:    Worry: Not on file    Inability: Not on file  . Transportation needs:    Medical: Not on file    Non-medical: Not on file  Tobacco Use  . Smoking status: Never Smoker  . Smokeless tobacco: Never Used  Substance and Sexual Activity  . Alcohol use: Yes    Alcohol/week: 5.0 standard drinks    Types: 5 Glasses of wine per week  . Drug use: No  . Sexual activity: Yes    Partners: Male    Birth control/protection: Post-menopausal  Lifestyle  . Physical activity:    Days per week: Not on file    Minutes per session: Not on file  . Stress: Not on file  Relationships  . Social connections:    Talks on phone: Not on file    Gets together: Not on file    Attends religious  service: Not on file    Active member of club or organization: Not on file    Attends meetings of clubs or organizations: Not on file    Relationship status: Not on file  Other Topics Concern  . Not on file  Social History Narrative   Originally from [place] -- Moved here 16 years ago.    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    Review of Systems - See HPI.  All other ROS are negative.  BP 122/70   Pulse 69   Temp 98.4 F (36.9 C) (Oral)   Resp 14   Ht 5\' 2"  (1.575 m)   Wt 217 lb (98.4 kg)   LMP 12/28/2010   SpO2 98%   BMI 39.69 kg/m   Physical Exam  Assessment/Plan: 1. Supraclavicular mass Unclear etiology. Cannot approximate margins but this seems to extend underneath the clavicle. Palpable area seems to be around 3 cm in diameter and firm. Immobile on exam. Also some lateral supraclavicular fullness noted without discrete mass. Will need imaging to further characterize. Will check CBC and BMP today. CT Chest to further assess.  - CT Chest W Contrast; Future - CBC w/Diff - Basic metabolic panel   Leeanne Rio, PA-C

## 2018-07-30 NOTE — Patient Instructions (Signed)
Please go to the lab today for blood work.  I will call you with your results. We will alter treatment regimen(s) if indicated by your results.   You will be contacted to schedule your CT scan. We will get this done ASAP.  I encourage you to increase hydration and the amount of fiber in your diet.  Start a daily probiotic (Align, Culturelle, Digestive Advantage, etc.). If no bowel movement within 24 hours, take 2 Tbs of Milk of Magnesia in a 4 oz glass of warmed prune juice every 2-3 days to help promote bowel movement. If no results within 24 hours, then repeat above regimen, adding a Dulcolax stool softener to regimen. If this does not promote a bowel movement, please call the office.

## 2018-07-31 LAB — CBC WITH DIFFERENTIAL/PLATELET
BASOS PCT: 0.6 % (ref 0.0–3.0)
Basophils Absolute: 0 10*3/uL (ref 0.0–0.1)
EOS ABS: 0.1 10*3/uL (ref 0.0–0.7)
EOS PCT: 1.5 % (ref 0.0–5.0)
HEMATOCRIT: 44.1 % (ref 36.0–46.0)
Hemoglobin: 14.8 g/dL (ref 12.0–15.0)
Lymphocytes Relative: 28.9 % (ref 12.0–46.0)
Lymphs Abs: 1.6 10*3/uL (ref 0.7–4.0)
MCHC: 33.6 g/dL (ref 30.0–36.0)
MCV: 88 fl (ref 78.0–100.0)
MONOS PCT: 4.8 % (ref 3.0–12.0)
Monocytes Absolute: 0.3 10*3/uL (ref 0.1–1.0)
NEUTROS ABS: 3.6 10*3/uL (ref 1.4–7.7)
Neutrophils Relative %: 64.2 % (ref 43.0–77.0)
PLATELETS: 194 10*3/uL (ref 150.0–400.0)
RBC: 5.01 Mil/uL (ref 3.87–5.11)
RDW: 13.7 % (ref 11.5–15.5)
WBC: 5.6 10*3/uL (ref 4.0–10.5)

## 2018-07-31 LAB — BASIC METABOLIC PANEL
BUN: 17 mg/dL (ref 6–23)
CALCIUM: 9.5 mg/dL (ref 8.4–10.5)
CO2: 28 mEq/L (ref 19–32)
CREATININE: 0.94 mg/dL (ref 0.40–1.20)
Chloride: 107 mEq/L (ref 96–112)
GFR: 65.38 mL/min (ref >60.00)
Glucose, Bld: 133 mg/dL — ABNORMAL HIGH (ref 70–99)
Potassium: 4 mEq/L (ref 3.5–5.1)
Sodium: 142 mEq/L (ref 135–145)

## 2018-08-05 ENCOUNTER — Ambulatory Visit
Admission: RE | Admit: 2018-08-05 | Discharge: 2018-08-05 | Disposition: A | Discharge status: Home / Self Care | Payer: 59 | Source: Ambulatory Visit | Attending: Physician Assistant | Admitting: Physician Assistant

## 2018-08-05 ENCOUNTER — Other Ambulatory Visit: Payer: Self-pay | Admitting: Physician Assistant

## 2018-08-05 DIAGNOSIS — M7989 Other specified soft tissue disorders: Secondary | ICD-10-CM | POA: Diagnosis not present

## 2018-08-05 DIAGNOSIS — R222 Localized swelling, mass and lump, trunk: Secondary | ICD-10-CM

## 2018-08-05 DIAGNOSIS — R221 Localized swelling, mass and lump, neck: Principal | ICD-10-CM

## 2018-08-05 MED ORDER — IOPAMIDOL (ISOVUE-300) INJECTION 61%
75.0000 mL | Freq: Once | INTRAVENOUS | Status: AC | PRN
  Administered 2018-08-05: 75 mL via INTRAVENOUS

## 2018-08-07 ENCOUNTER — Ambulatory Visit
Admission: RE | Admit: 2018-08-07 | Discharge: 2018-08-07 | Disposition: A | Discharge status: Home / Self Care | Payer: 59 | Source: Ambulatory Visit | Attending: Physician Assistant | Admitting: Physician Assistant

## 2018-08-07 DIAGNOSIS — M7989 Other specified soft tissue disorders: Secondary | ICD-10-CM | POA: Diagnosis not present

## 2018-08-07 DIAGNOSIS — R221 Localized swelling, mass and lump, neck: Principal | ICD-10-CM

## 2018-08-09 ENCOUNTER — Other Ambulatory Visit: Payer: Self-pay | Admitting: Physician Assistant

## 2018-08-09 DIAGNOSIS — M7989 Other specified soft tissue disorders: Secondary | ICD-10-CM

## 2018-08-09 DIAGNOSIS — R221 Localized swelling, mass and lump, neck: Principal | ICD-10-CM

## 2018-08-09 DIAGNOSIS — M25519 Pain in unspecified shoulder: Secondary | ICD-10-CM

## 2018-08-09 DIAGNOSIS — M542 Cervicalgia: Secondary | ICD-10-CM

## 2018-08-09 DIAGNOSIS — R222 Localized swelling, mass and lump, trunk: Secondary | ICD-10-CM

## 2018-08-14 ENCOUNTER — Encounter: Payer: Self-pay | Admitting: Physician Assistant

## 2018-08-23 DIAGNOSIS — M542 Cervicalgia: Secondary | ICD-10-CM | POA: Diagnosis not present

## 2018-08-27 ENCOUNTER — Other Ambulatory Visit: Payer: Self-pay | Admitting: Obstetrics & Gynecology

## 2018-08-27 ENCOUNTER — Encounter: Payer: Self-pay | Admitting: Physician Assistant

## 2018-08-27 NOTE — Telephone Encounter (Signed)
Medication refill request: Xanax Last AEX:  02/26/18 Next AEX: 06/27/19 Last MMG (if hormonal medication request): 07/01/18 Bi-rads 1 neg  Refill authorized: #30 with 3 rf

## 2018-08-30 ENCOUNTER — Encounter: Payer: Self-pay | Admitting: Physician Assistant

## 2018-08-31 ENCOUNTER — Other Ambulatory Visit: Payer: Self-pay | Admitting: Orthopedic Surgery

## 2018-08-31 DIAGNOSIS — Z600 Problems of adjustment to life-cycle transitions: Secondary | ICD-10-CM | POA: Diagnosis not present

## 2018-08-31 DIAGNOSIS — M5418 Radiculopathy, sacral and sacrococcygeal region: Secondary | ICD-10-CM

## 2018-08-31 DIAGNOSIS — Z630 Problems in relationship with spouse or partner: Secondary | ICD-10-CM | POA: Diagnosis not present

## 2018-09-10 ENCOUNTER — Ambulatory Visit
Admission: RE | Admit: 2018-09-10 | Discharge: 2018-09-10 | Disposition: A | Discharge status: Home / Self Care | Payer: 59 | Source: Ambulatory Visit | Attending: Orthopedic Surgery | Admitting: Orthopedic Surgery

## 2018-09-10 ENCOUNTER — Other Ambulatory Visit: Payer: Self-pay | Admitting: Orthopedic Surgery

## 2018-09-10 DIAGNOSIS — M5418 Radiculopathy, sacral and sacrococcygeal region: Secondary | ICD-10-CM

## 2018-09-10 DIAGNOSIS — M25519 Pain in unspecified shoulder: Secondary | ICD-10-CM | POA: Diagnosis not present

## 2018-09-10 MED ORDER — GADOBENATE DIMEGLUMINE 529 MG/ML IV SOLN
20.0000 mL | Freq: Once | INTRAVENOUS | Status: AC | PRN
  Administered 2018-09-10: 20 mL via INTRAVENOUS

## 2018-09-12 DIAGNOSIS — M19011 Primary osteoarthritis, right shoulder: Secondary | ICD-10-CM | POA: Diagnosis not present

## 2018-09-17 DIAGNOSIS — S161XXD Strain of muscle, fascia and tendon at neck level, subsequent encounter: Secondary | ICD-10-CM | POA: Diagnosis not present

## 2018-09-17 DIAGNOSIS — S46011D Strain of muscle(s) and tendon(s) of the rotator cuff of right shoulder, subsequent encounter: Secondary | ICD-10-CM | POA: Diagnosis not present

## 2018-09-19 DIAGNOSIS — S161XXD Strain of muscle, fascia and tendon at neck level, subsequent encounter: Secondary | ICD-10-CM | POA: Diagnosis not present

## 2018-09-19 DIAGNOSIS — S46011D Strain of muscle(s) and tendon(s) of the rotator cuff of right shoulder, subsequent encounter: Secondary | ICD-10-CM | POA: Diagnosis not present

## 2018-09-24 DIAGNOSIS — S161XXD Strain of muscle, fascia and tendon at neck level, subsequent encounter: Secondary | ICD-10-CM | POA: Diagnosis not present

## 2018-09-24 DIAGNOSIS — S46011D Strain of muscle(s) and tendon(s) of the rotator cuff of right shoulder, subsequent encounter: Secondary | ICD-10-CM | POA: Diagnosis not present

## 2018-09-26 DIAGNOSIS — S161XXD Strain of muscle, fascia and tendon at neck level, subsequent encounter: Secondary | ICD-10-CM | POA: Diagnosis not present

## 2018-09-26 DIAGNOSIS — S46011D Strain of muscle(s) and tendon(s) of the rotator cuff of right shoulder, subsequent encounter: Secondary | ICD-10-CM | POA: Diagnosis not present

## 2018-10-01 DIAGNOSIS — S161XXD Strain of muscle, fascia and tendon at neck level, subsequent encounter: Secondary | ICD-10-CM | POA: Diagnosis not present

## 2018-10-01 DIAGNOSIS — S46011D Strain of muscle(s) and tendon(s) of the rotator cuff of right shoulder, subsequent encounter: Secondary | ICD-10-CM | POA: Diagnosis not present

## 2019-01-22 ENCOUNTER — Encounter: Payer: Self-pay | Admitting: Obstetrics & Gynecology

## 2019-01-22 ENCOUNTER — Telehealth: Payer: Self-pay | Admitting: Obstetrics & Gynecology

## 2019-01-22 NOTE — Telephone Encounter (Signed)
Patient sent the following correspondence through [place]. Routing to triage to assist patient with request.  Hello Dr. Sabra Heck, hope you are doing well. I would like to taper off the Prozac. I am taking 10 mg every evening. What is the best way to taper?

## 2019-01-22 NOTE — Telephone Encounter (Signed)
Dr. Sabra Heck -please review and advise.

## 2019-01-23 NOTE — Telephone Encounter (Signed)
She can decrease to 1/2 tab daily for 4 weeks and then 1/2 tab every other day for two weeks and then stop.  She should call if has any side effects with tapering off.  Thanks.

## 2019-01-23 NOTE — Telephone Encounter (Signed)
Call to patient. Instructions provided as directed by Dr Sabra Heck. Encounter closed.

## 2019-02-01 ENCOUNTER — Encounter: Payer: Self-pay | Admitting: Obstetrics & Gynecology

## 2019-02-03 ENCOUNTER — Telehealth: Payer: Self-pay | Admitting: Obstetrics & Gynecology

## 2019-02-03 NOTE — Telephone Encounter (Signed)
Message  Hi Dr. Sabra Heck! I've decided to go back to the regular dose of Prozac -- about a week after tapering I noticed feeling agitated so maybe I need to stay on it a bit longer. Take care and stay safe.

## 2019-02-03 NOTE — Telephone Encounter (Signed)
Spoke with patient. Patient is taking Prozac 10 mg PO daily, would like to continue at this dosage, tried to wean, not effective. Does not need refills at this time, has 2 on file. Will see Dr. Sabra Heck for AEX 06/27/19, is aware to return call if refills needed prior to AEX.   Routing to provider for final review. Patient is agreeable to disposition. Will close encounter.

## 2019-02-03 NOTE — Telephone Encounter (Signed)
Left message to call Collette Pescador, RN at [facility] [phone].   

## 2019-02-07 ENCOUNTER — Encounter: Payer: Self-pay | Admitting: Obstetrics & Gynecology

## 2019-02-07 ENCOUNTER — Telehealth: Payer: Self-pay | Admitting: Obstetrics & Gynecology

## 2019-02-07 NOTE — Telephone Encounter (Signed)
Patient notified I don't see where she's been typed/screened before in [place].

## 2019-02-07 NOTE — Telephone Encounter (Signed)
Patient sent the following message through [place]. Routing to triage to assist patient with request.  Hello! Just wondering if there's anything in my record that notes my blood type. Obviously not urgent but curious. Thank you!

## 2019-02-07 NOTE — Telephone Encounter (Signed)
Patient is returning a call to [place].

## 2019-02-07 NOTE — Telephone Encounter (Signed)
Left message to call Jerre Vandrunen, CMA. °

## 2019-03-20 ENCOUNTER — Encounter: Payer: Self-pay | Admitting: Physician Assistant

## 2019-03-30 ENCOUNTER — Other Ambulatory Visit: Payer: Self-pay | Admitting: Obstetrics & Gynecology

## 2019-04-01 NOTE — Telephone Encounter (Signed)
Patient is returning a call to Emily. °

## 2019-04-01 NOTE — Telephone Encounter (Signed)
Medication refill request: Prozac Last AEX:  02-26-18 SM  Next AEX: 06-27-2019 Last MMG (if hormonal medication request): n/a Refill authorized: Today, please advise.   Spoke with patient. Patient confirmed dosage of 10mg  daily, states weaning was ineffective. Pharmacy confirmed.   Medication pended for #30, 4RF. Please refill if appropriate.

## 2019-04-01 NOTE — Telephone Encounter (Signed)
Message left to return call to Avinash Maltos at [phone].    

## 2019-04-04 ENCOUNTER — Encounter: Payer: Self-pay | Admitting: Physician Assistant

## 2019-04-04 ENCOUNTER — Ambulatory Visit (INDEPENDENT_AMBULATORY_CARE_PROVIDER_SITE_OTHER): Payer: 59 | Admitting: *Deleted

## 2019-04-04 ENCOUNTER — Other Ambulatory Visit: Payer: Self-pay

## 2019-04-04 DIAGNOSIS — Z23 Encounter for immunization: Secondary | ICD-10-CM

## 2019-04-24 IMAGING — MG DIGITAL SCREENING BILATERAL MAMMOGRAM WITH CAD
4 series · 4 of 4 positions shown · non-contrast
Comparison: Previous exam(s).

CLINICAL DATA: Screening.

EXAM:
DIGITAL SCREENING BILATERAL MAMMOGRAM WITH CAD

[L CC]
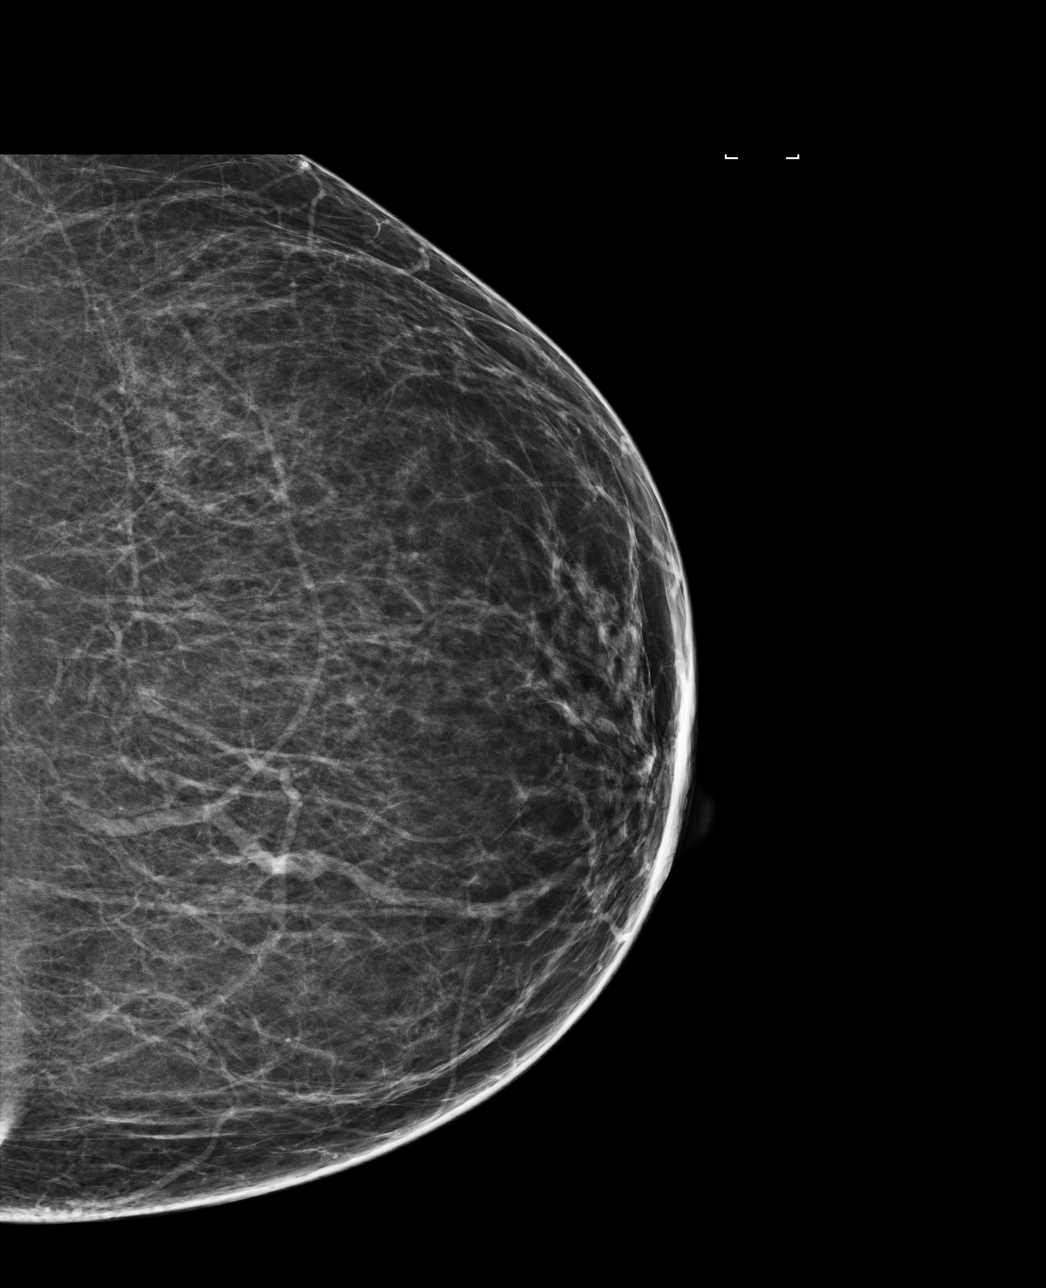

[R CC]
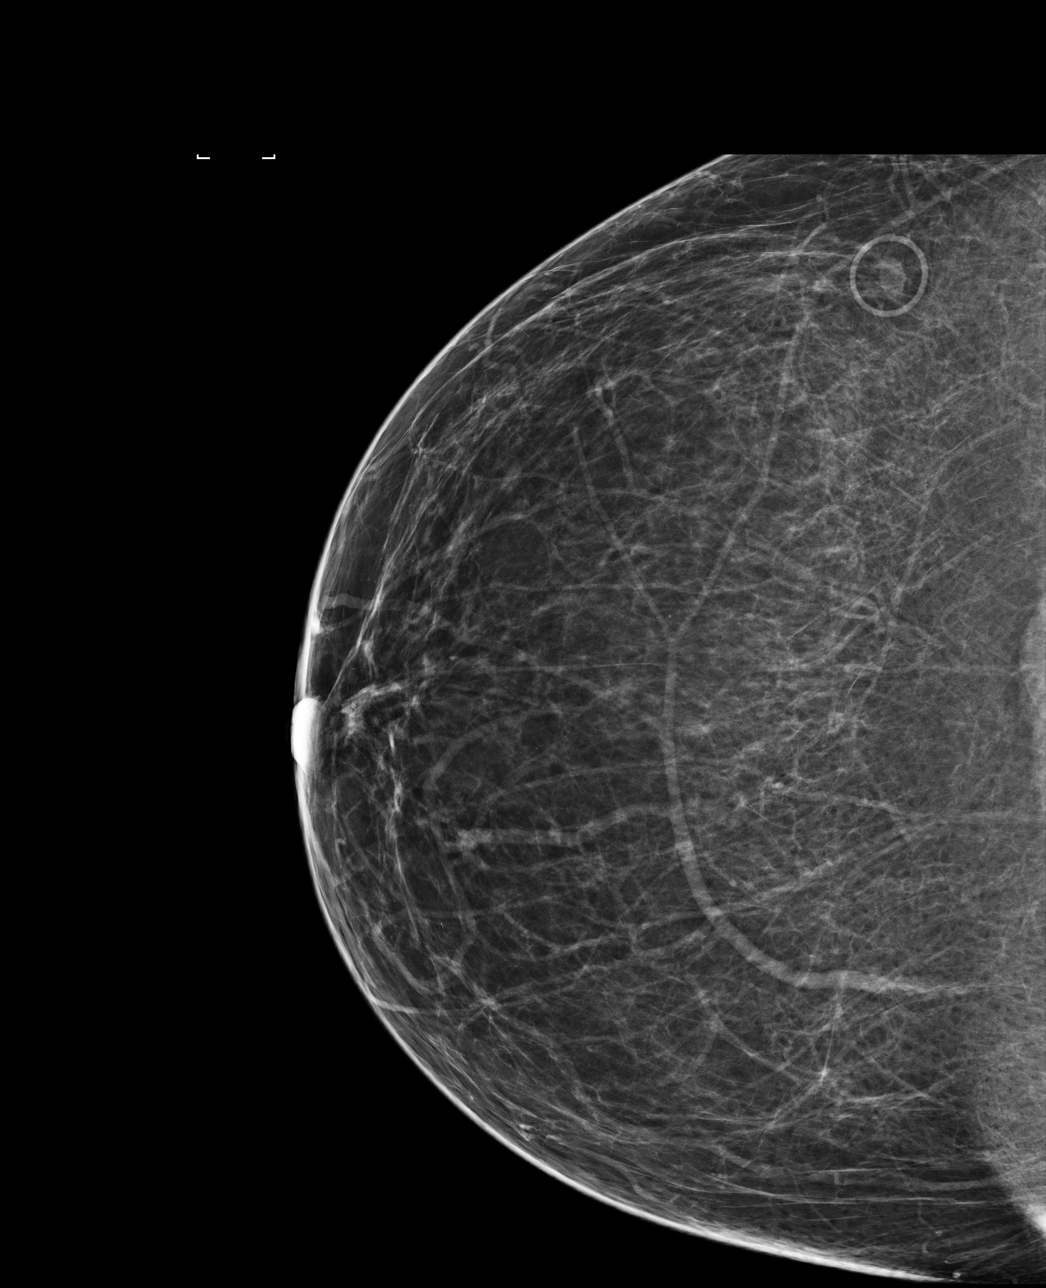

[L MLO]
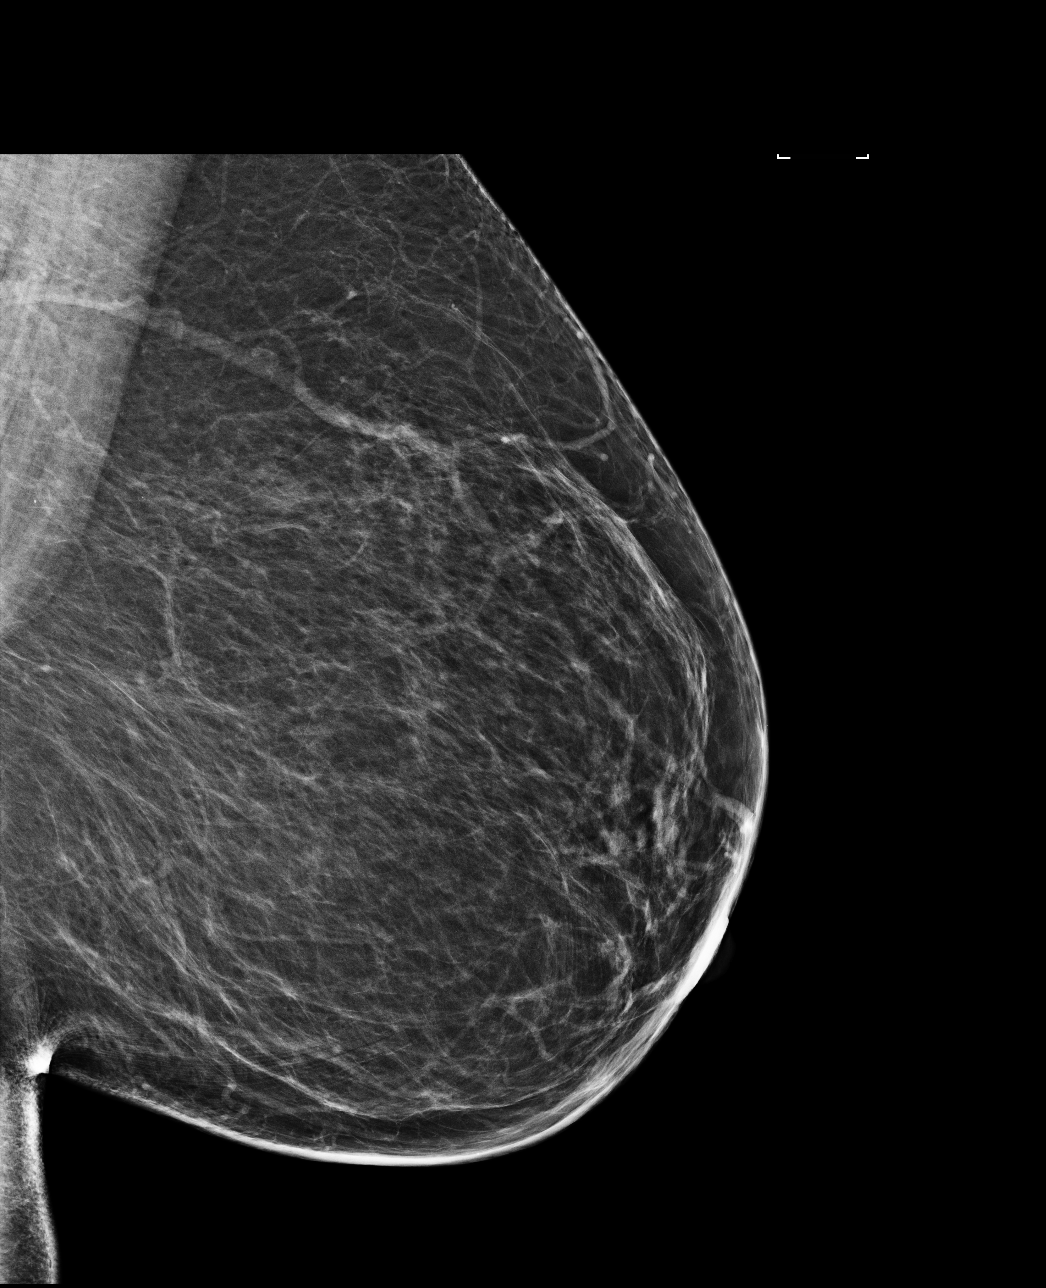

[R MLO]
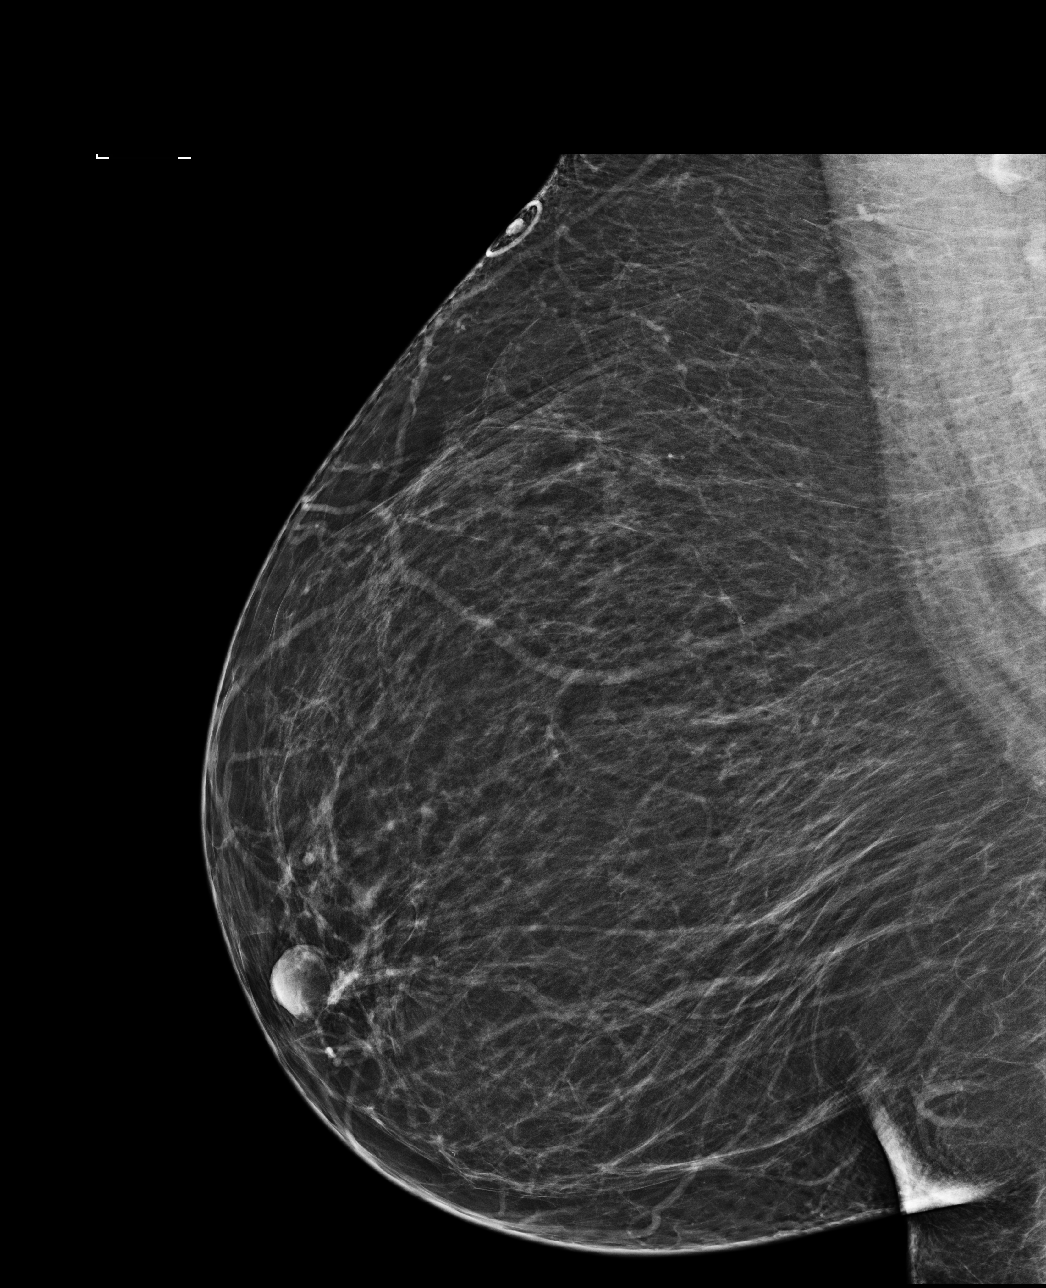

[4 of 4 positions shown; findings below may reference images not displayed]

ACR Breast Density Category b: There are scattered areas of
fibroglandular density.
FINDINGS: There are no findings suspicious for malignancy. Images were
processed with CAD.
IMPRESSION: No mammographic evidence of malignancy. A result letter of this
screening mammogram will be mailed directly to the patient.

RECOMMENDATION:
Screening mammogram in one year. (Code:AS-G-LCT)

BI-RADS CATEGORY  1: Negative.

## 2019-05-21 ENCOUNTER — Other Ambulatory Visit: Payer: Self-pay | Admitting: Obstetrics & Gynecology

## 2019-05-21 DIAGNOSIS — Z1231 Encounter for screening mammogram for malignant neoplasm of breast: Secondary | ICD-10-CM

## 2019-06-25 ENCOUNTER — Other Ambulatory Visit: Payer: Self-pay

## 2019-06-26 ENCOUNTER — Other Ambulatory Visit: Payer: Self-pay

## 2019-06-26 NOTE — Telephone Encounter (Signed)
Medication refill request: Fluoxetine Hcl 10 mg tablet Last AEX:  02/26/2018 Next AEX: 06/27/2019 Last MMG (if hormonal medication request): N/A Refill authorized: Fluoxentine Hcl 10 mg tablet daily #90 3RF pending

## 2019-06-27 ENCOUNTER — Encounter

## 2019-06-27 ENCOUNTER — Ambulatory Visit (INDEPENDENT_AMBULATORY_CARE_PROVIDER_SITE_OTHER): Payer: 59 | Admitting: Obstetrics & Gynecology

## 2019-06-27 ENCOUNTER — Other Ambulatory Visit: Payer: Self-pay

## 2019-06-27 ENCOUNTER — Other Ambulatory Visit (HOSPITAL_COMMUNITY)
Admission: RE | Admit: 2019-06-27 | Discharge: 2019-06-27 | Disposition: A | Discharge status: Home / Self Care | Payer: 59 | Source: Ambulatory Visit | Attending: Obstetrics & Gynecology | Admitting: Obstetrics & Gynecology

## 2019-06-27 ENCOUNTER — Encounter: Payer: Self-pay | Admitting: Obstetrics & Gynecology

## 2019-06-27 VITALS — BP 128/80 | HR 60 | Temp 97.9°F | Resp 12 | Ht 61.25 in | Wt 228.4 lb

## 2019-06-27 DIAGNOSIS — Z124 Encounter for screening for malignant neoplasm of cervix: Secondary | ICD-10-CM

## 2019-06-27 DIAGNOSIS — Z01419 Encounter for gynecological examination (general) (routine) without abnormal findings: Secondary | ICD-10-CM

## 2019-06-27 MED ORDER — FLUOXETINE HCL 10 MG PO TABS: 10.0000 mg | ORAL_TABLET | Freq: Every day | ORAL | 3 refills | Status: DC

## 2019-06-27 NOTE — Progress Notes (Signed)
58 y.o. G41P2003 Married White or Caucasian female here for annual exam.  Doing well.  Denies vaginal bleeding.  Hot flashes have finally resolved.    Saw urology, Dr. Lovena Neighbours, two weeks ago.  She reports he said "my urine was funny looking".  He did some additional testing but she does not know the results.    Patient's last menstrual period was 12/28/2010.          Sexually active: Yes.    The current method of family planning is post menopausal status.    Exercising: Yes.    walking and zumba Smoker:  no  Health Maintenance: Pap:   02/26/18 Negative  09/10/15 neg. HR HPV:neg  History of abnormal Pap:  Yes, 1998  MMG:  06/28/18 BIRADS 1 negative/density b -- scheduled 07/10/19 Colonoscopy:  2015 f/u 10 years  BMD:   never TDaP:  2018 Pneumonia vaccine(s):  none Shingrix:   none Hep C testing: 12/08/16 Neg Screening Labs: fasting lab 02/2018.     reports that she has never smoked. She has never used smokeless tobacco. She reports current alcohol use of about 5.0 standard drinks of alcohol per week. She reports that she does not use drugs.  Past Medical History:  Diagnosis Date  . Constipation   . Environmental allergies   . Frequency of urination   . GERD (gastroesophageal reflux disease)    diet controlled -tx tums  . Hematuria   . Hiatal hernia   . History of gestational hypertension   . History of kidney stones   . History of pulmonary embolism 01/21/2011   bilateral -- RML, RLL, LLL  . OA (osteoarthritis)   . Pain in Achilles tendon    right  . Right ureteral stone   . Urgency of urination   . Uterine fibroid   . Wears glasses     Past Surgical History:  Procedure Laterality Date  . [place]  . COLONOSCOPY  11/12/2013  . CYSTOSCOPY/URETEROSCOPY/HOLMIUM LASER/STENT PLACEMENT Right 03/21/2017   Procedure: CYSTOSCOPY/ RETROGRADE/URETEROSCOPY/STENT PLACEMENT;  Surgeon: Ceasar Mons, MD;  Location: [facility];  Service:  Urology;  Laterality: Right;  . CYSTOSCOPY/URETEROSCOPY/HOLMIUM LASER/STENT PLACEMENT Right 03/30/2017   Procedure: CYSTOSCOPY/URETEROSCOPY/HOLMIUM LASER/STENT EXCHANGE;  Surgeon: Ceasar Mons, MD;  Location: [facility];  Service: Urology;  Laterality: Right;  . DILATATION & CURETTAGE/HYSTEROSCOPY WITH MYOSURE N/A 01/08/2017   Procedure: [place];  Surgeon: Megan Salon, MD;  Location: [facility];  Service: Gynecology;  Laterality: N/A;  . HOLMIUM LASER APPLICATION Right [id number]   Procedure: HOLMIUM LASER APPLICATION;  Surgeon: Ceasar Mons, MD;  Location: [facility];  Service: Urology;  Laterality: Right;  . LAPAROSCOPIC CHOLECYSTECTOMY  1995  . STRABISMUS SURGERY Bilateral age 67  . TONSILLECTOMY  child  . TRANSTHORACIC ECHOCARDIOGRAM  02/07/2017   EF 55-60%  . WISDOM TOOTH EXTRACTION      Current Outpatient Medications  Medication Sig Dispense Refill  . ALPRAZolam (XANAX) 0.5 MG tablet TAKE 1 TABLET (0.5 MG TOTAL) BY MOUTH AT BEDTIME AS NEEDED FOR ANXIETY. 30 tablet 3  . Ascorbic Acid (VITAMIN C) 1000 MG tablet Take 1,000 mg by mouth daily.    . B Complex-C (SUPER B COMPLEX PO) Take 1 capsule by mouth daily.    . calcium carbonate (TUMS - DOSED IN MG ELEMENTAL CALCIUM) 500 MG chewable tablet Chew 3 tablets by mouth daily as needed for indigestion or heartburn.    Marland Kitchen  Cholecalciferol (VITAMIN D3) 2000 units TABS Take 2,000 Int'l Units by mouth daily.    Marland Kitchen EVENING PRIMROSE OIL PO Take 1,300 mg by mouth daily.     Marland Kitchen FLUoxetine (PROZAC) 10 MG tablet Take 1 tablet (10 mg total) by mouth daily. 90 tablet 3  . Ginkgo Biloba 100 MG CAPS Take by mouth.    Marland Kitchen MAGNESIUM CITRATE PO Take 400 mg by mouth at bedtime.    . Multiple Vitamin (MULTIVITAMIN) capsule Take by mouth.    Ernestine Conrad 3-6-9 Fatty Acids (OMEGA-3-6-9 PO) Take 1,600 mg by mouth daily.    . Probiotic Product (PROBIOTIC PO) Take 1 capsule by mouth  daily.    . Selenium 200 MCG CAPS      No current facility-administered medications for this visit.     Family History  Problem Relation Age of Onset  . Diabetes Father   . Hypertension Father   . Deep vein thrombosis Father   . Congestive Heart Failure Father   . Lung cancer Mother   . Tuberculosis Mother        as a child  . Lymphoma Mother   . Non-Hodgkin's lymphoma Mother   . Skin cancer Maternal Grandmother   . Colon cancer Paternal Aunt        x2  . Heart attack Brother        half brother - 44  . Healthy Daughter   . Crohn's disease Daughter   . Allergies Daughter   . Breast cancer Neg Hx     Review of Systems  All other systems reviewed and are negative.   Exam:   BP 128/80 (BP Location: Right Arm, Patient Position: Sitting, Cuff Size: Normal)   Pulse 60   Temp 97.9 F (36.6 C) (Temporal)   Resp 12   Ht 5' 1.25" (1.556 m)   Wt 228 lb 6.4 oz (103.6 kg)   LMP 12/28/2010   BMI 42.80 kg/m    Height: 5' 1.25" (155.6 cm)  Ht Readings from Last 3 Encounters:  06/27/19 5' 1.25" (1.556 m)  07/30/18 5\' 2"  (1.575 m)  05/23/18 5\' 2"  (1.575 m)    General appearance: alert, cooperative and appears stated age Head: Normocephalic, without obvious abnormality, atraumatic Neck: no adenopathy, supple, symmetrical, trachea midline and thyroid normal to inspection and palpation Lungs: clear to auscultation bilaterally Breasts: normal appearance, no masses or tenderness Heart: regular rate and rhythm Abdomen: soft, non-tender; bowel sounds normal; no masses,  no organomegaly Extremities: extremities normal, atraumatic, no cyanosis or edema Skin: Skin color, texture, turgor normal. No rashes or lesions Lymph nodes: Cervical, supraclavicular, and axillary nodes normal. No abnormal inguinal nodes palpated Neurologic: Grossly normal   Pelvic: External genitalia:  no lesions              Urethra:  normal appearing urethra with no masses, tenderness or lesions               Bartholins and Skenes: normal                 Vagina: normal appearing vagina with normal color and discharge, no lesions              Cervix: no lesions              Pap taken: Yes.   Bimanual Exam:  Uterus:  normal size, contour, position, consistency, mobility, non-tender              Adnexa: no mass, fullness,  tenderness               Rectovaginal: Confirms               Anus:  normal sphincter tone, no lesions  Chaperone was present for exam.  A:  Well Woman with normal exam PMP, no HRT H/o uterine fibroids H/o nephrolithiasis H/O PE 8/12.  On Coumadin x 1 year.  Now off x 2 years. Vaginal atrophy/dysparuenia H/o colon cancer in two paternal aunts  P:   Mammogram guideliens reviewed.  Has this scheduled. pap smear guidelines reviewed today.  She desires HR HPV testing today. Lab work was done last year and was all normal.  Not obtained today. Plan BMD between age 75-65 Colonoscopy UTD RF for prozac 10mg  daily done earlier this week.  #90/4RF Release of records from urology signed today Return annually or prn

## 2019-07-03 LAB — MOLECULAR ANCILLARY ONLY: Comment: NEGATIVE

## 2019-07-03 LAB — MOLECULAR ANCILLARY ONLY???: High risk HPV: NEGATIVE

## 2019-07-10 ENCOUNTER — Ambulatory Visit
Admission: RE | Admit: 2019-07-10 | Discharge: 2019-07-10 | Disposition: A | Discharge status: Home / Self Care | Payer: 59 | Source: Ambulatory Visit | Attending: Obstetrics & Gynecology | Admitting: Obstetrics & Gynecology

## 2019-07-10 ENCOUNTER — Other Ambulatory Visit: Payer: Self-pay

## 2019-07-10 DIAGNOSIS — Z1231 Encounter for screening mammogram for malignant neoplasm of breast: Secondary | ICD-10-CM

## 2019-12-06 ENCOUNTER — Other Ambulatory Visit: Payer: Self-pay | Admitting: Obstetrics & Gynecology

## 2020-02-09 ENCOUNTER — Other Ambulatory Visit: Payer: Self-pay | Admitting: Obstetrics & Gynecology

## 2020-02-09 NOTE — Telephone Encounter (Signed)
Ok to switch this pt to capsules but we need to check with her about dosage.  I prescribed 10mg  in December but she did know it was ok to increase dosage if needed.  So, I need to confirm she is on the 20mg  dosage.  Could you confirm with pt?  Thanks.  Vinnie Level

## 2020-02-10 ENCOUNTER — Telehealth: Payer: Self-pay

## 2020-02-10 NOTE — Telephone Encounter (Signed)
Tried calling patient regarding refill for Fluoxetine. No answer, left message for patient to call me back.

## 2020-02-10 NOTE — Telephone Encounter (Signed)
Patient returned phone call and states that she is taking Fluoxetine 10mg  tablets. Refill request that was received was for Fluoxetine 20mg  capsules. Refill has been refused. Patient verbalizes understanding and is agreeable. Okay to close encounter.

## 2020-02-10 NOTE — Telephone Encounter (Signed)
See telephone encounter 02/10/20.

## 2020-04-30 ENCOUNTER — Other Ambulatory Visit: Payer: Self-pay

## 2020-04-30 ENCOUNTER — Ambulatory Visit (INDEPENDENT_AMBULATORY_CARE_PROVIDER_SITE_OTHER): Payer: 59

## 2020-04-30 DIAGNOSIS — Z23 Encounter for immunization: Secondary | ICD-10-CM | POA: Diagnosis not present

## 2020-06-21 NOTE — Progress Notes (Signed)
I, Sharon Page, LAT, ATC acting as a scribe for Sharon Leader, MD.  Subjective:    CC: Left knee pain   HPI: Pt is a 59 y/o female presenting w/ c/o L knee pain. Pt reports L knee pain has been going on for 1.5 months. Pt states that she recent moved and did  a lot of lifting/bending and feels she overdid it. Pt locates pain to medial aspect of knee but also c/o pn over the lateral aspect that radiates into calf. Pt c/o pain will wake her up at night.  L knee swelling: none Aggravating factors: walking and standing Treatments tried: IBU  Pertinent review of Systems: No fevers or chills  Relevant historical information: Cardiomegaly.  History of pulmonary embolism   Objective:    Vitals:   06/22/20 1604  BP: 128/78  Pulse: 68  SpO2: 98%   General: Well Developed, well nourished, and in no acute distress.   MSK: Left knee moderate effusion normal-appearing otherwise Normal motion with crepitation. Tender palpation medial joint line. Stable ligaments exam. Negative McMurray's test.  Lab and Radiology Results  X-ray images left knee obtained today present independently interpreted. No fractures visible.  Moderate patellofemoral DJD.  No significant or severe medial or lateral compartment DJD. Await formal radiology review  Procedure: Real-time Ultrasound Guided Injection of left knee subacromial bursa Device: Philips Affiniti 50G Images permanently stored and available for review in PACS Ultrasound examination of knee prior to injection reveals moderate effusion.  Medial and lateral joint lines intact.  No visible meniscus tear.  No Baker's cyst. Verbal informed consent obtained.  Discussed risks and benefits of procedure. Warned about infection bleeding damage to structures skin hypopigmentation and fat atrophy among others. Patient expresses understanding and agreement Time-out conducted.   Noted no overlying erythema, induration, or other signs of local  infection.   Skin prepped in a sterile fashion.   Local anesthesia: Topical Ethyl chloride.   With sterile technique and under real time ultrasound guidance:  40 mg of Kenalog and 2 mL of Marcaine injected into the knee. Fluid seen entering the joint.   Completed without difficulty   Pain immediately resolved suggesting accurate placement of the medication.   Advised to call if fevers/chills, erythema, induration, drainage, or persistent bleeding.   Images permanently stored and available for review in the ultrasound unit.  Impression: Technically successful ultrasound guided injection.        Impression and Recommendations:    Assessment and Plan: 59 y.o. female with left knee pain after increasing activity level with a move.  Pain thought to be due to exacerbation of DJD.  Discussed options.  Plan for conservative management with Voltaren gel and steroid injection.  Recheck back if not improving.  Precautions reviewed.Sharon Page  PDMP not reviewed this encounter. Orders Placed This Encounter  Procedures  . [place] LIMITED JOINT SPACE STRUCTURES LOW LEFT(NO LINKED CHARGES)    Standing Status:   Future    Number of Occurrences:   1    Standing Expiration Date:   12/20/2020    Order Specific Question:   Reason for Exam (SYMPTOM  OR DIAGNOSIS REQUIRED)    Answer:   chronic left knee pain    Order Specific Question:   Preferred imaging location?    Answer:   [place]  . DG Knee AP/LAT W/[place] Left    Standing Status:   Future    Number of Occurrences:   1    Standing Expiration Date:  06/22/2021    Order Specific Question:   Reason for Exam (SYMPTOM  OR DIAGNOSIS REQUIRED)    Answer:   chronic left knee pain    Order Specific Question:   Preferred imaging location?    Answer:   [facility]    Order Specific Question:   Is patient pregnant?    Answer:   No   No orders of the defined types were placed in this encounter.   Discussed warning signs or  symptoms. Please see discharge instructions. Patient expresses understanding.   The above documentation has been reviewed and is accurate and complete Sharon Page, M.D.

## 2020-06-22 ENCOUNTER — Ambulatory Visit (INDEPENDENT_AMBULATORY_CARE_PROVIDER_SITE_OTHER): Payer: 59 | Admitting: Family Medicine

## 2020-06-22 ENCOUNTER — Ambulatory Visit (INDEPENDENT_AMBULATORY_CARE_PROVIDER_SITE_OTHER): Payer: 59

## 2020-06-22 ENCOUNTER — Ambulatory Visit: Payer: 59 | Admitting: Physician Assistant

## 2020-06-22 ENCOUNTER — Other Ambulatory Visit: Payer: Self-pay

## 2020-06-22 ENCOUNTER — Ambulatory Visit: Payer: Self-pay

## 2020-06-22 VITALS — BP 128/78 | HR 68 | Ht 61.0 in

## 2020-06-22 DIAGNOSIS — G8929 Other chronic pain: Secondary | ICD-10-CM | POA: Diagnosis not present

## 2020-06-22 DIAGNOSIS — M25562 Pain in left knee: Secondary | ICD-10-CM

## 2020-06-22 NOTE — Patient Instructions (Signed)
Thank you for coming in today.  Please use voltaren gel up to 4x daily for pain as needed.   Call or go to the ER if you develop a large red swollen joint with extreme pain or oozing puss.   Let me know if not better

## 2020-06-24 ENCOUNTER — Encounter: Payer: Self-pay | Admitting: Family Medicine

## 2020-06-24 NOTE — Progress Notes (Signed)
Arthritis is present in the knee mostly underneath the kneecap.

## 2020-07-01 ENCOUNTER — Other Ambulatory Visit: Payer: Self-pay | Admitting: Obstetrics & Gynecology

## 2020-07-01 DIAGNOSIS — Z1231 Encounter for screening mammogram for malignant neoplasm of breast: Secondary | ICD-10-CM

## 2020-07-16 ENCOUNTER — Other Ambulatory Visit: Payer: Self-pay | Admitting: Obstetrics & Gynecology

## 2020-07-28 ENCOUNTER — Telehealth: Payer: Self-pay | Admitting: Physician Assistant

## 2020-07-28 ENCOUNTER — Other Ambulatory Visit: Payer: Self-pay | Admitting: Obstetrics & Gynecology

## 2020-07-28 NOTE — Telephone Encounter (Signed)
Left a vm message with PCP recommendations for patient to set up an appointment to discuss medication management. (fluoxetine)

## 2020-07-28 NOTE — Telephone Encounter (Signed)
Please advise 

## 2020-07-28 NOTE — Telephone Encounter (Signed)
Patient has not been seen in 2 years.  She would need an appointment to discuss this.

## 2020-07-28 NOTE — Telephone Encounter (Signed)
Pt called in asking if Selena Batten would take over the Fluoxetine, she states that she has been trying to get in touch with Dr. Rondel Baton office with no luck. Pt uses [facility] [address] in [place].  Please advise

## 2020-07-29 ENCOUNTER — Other Ambulatory Visit: Payer: Self-pay | Admitting: Obstetrics & Gynecology

## 2020-07-29 NOTE — Telephone Encounter (Signed)
Dr.Miller patient)   I called and spoke with patient and explained she will need to schedule a visit with Dr.Miller, patient reports she is scheduled with her next month. She did try to send a my chart message but Dr.Miller name is not showing in her my chart to send a message. I offered her to schedule visit her at [facility],( she is overdue for annual exam) patient declined. Patient said she is completely out of Prozac 10 mg medication.  She asked if a short supply could be sent, I explained to her I can't promise you would approve this Rx. Patient preferred I still ask provider.

## 2020-07-29 NOTE — Telephone Encounter (Signed)
Patient left voicemail regarding medication refill. Patient stated that she is completely out of her medication and the pharmacy has not been able to get in touch with Dr. Hyacinth Meeker regarding a refill request.

## 2020-07-29 NOTE — Telephone Encounter (Signed)
Noreene Larsson RN provided me with a phone number for Dr.Miller new office to call and for refill on Prozac. I called and left detailed message on cell per DPR access.  Rx will be denied

## 2020-07-30 ENCOUNTER — Other Ambulatory Visit: Payer: Self-pay | Admitting: Obstetrics & Gynecology

## 2020-08-19 ENCOUNTER — Other Ambulatory Visit: Payer: Self-pay

## 2020-08-19 ENCOUNTER — Ambulatory Visit
Admission: RE | Admit: 2020-08-19 | Discharge: 2020-08-19 | Disposition: A | Discharge status: Home / Self Care | Payer: 59 | Source: Ambulatory Visit | Attending: Obstetrics & Gynecology | Admitting: Obstetrics & Gynecology

## 2020-08-19 DIAGNOSIS — Z1231 Encounter for screening mammogram for malignant neoplasm of breast: Secondary | ICD-10-CM

## 2020-08-21 ENCOUNTER — Other Ambulatory Visit: Payer: Self-pay | Admitting: Obstetrics & Gynecology

## 2020-08-30 ENCOUNTER — Other Ambulatory Visit: Payer: Self-pay | Admitting: Obstetrics & Gynecology

## 2020-09-10 ENCOUNTER — Ambulatory Visit: Payer: 59 | Admitting: Obstetrics & Gynecology

## 2020-09-10 ENCOUNTER — Ambulatory Visit: Payer: 59

## 2020-09-24 ENCOUNTER — Encounter (HOSPITAL_BASED_OUTPATIENT_CLINIC_OR_DEPARTMENT_OTHER): Payer: Self-pay | Admitting: Obstetrics & Gynecology

## 2020-09-24 ENCOUNTER — Ambulatory Visit (INDEPENDENT_AMBULATORY_CARE_PROVIDER_SITE_OTHER): Payer: 59 | Admitting: Obstetrics & Gynecology

## 2020-09-24 ENCOUNTER — Other Ambulatory Visit: Payer: Self-pay

## 2020-09-24 VITALS — BP 122/86 | HR 64 | Ht 61.0 in | Wt 226.0 lb

## 2020-09-24 DIAGNOSIS — Z800 Family history of malignant neoplasm of digestive organs: Secondary | ICD-10-CM

## 2020-09-24 DIAGNOSIS — Z86711 Personal history of pulmonary embolism: Secondary | ICD-10-CM

## 2020-09-24 DIAGNOSIS — Z01419 Encounter for gynecological examination (general) (routine) without abnormal findings: Secondary | ICD-10-CM | POA: Diagnosis not present

## 2020-09-24 DIAGNOSIS — Z780 Asymptomatic menopausal state: Secondary | ICD-10-CM

## 2020-09-24 NOTE — Progress Notes (Signed)
60 y.o. G25P2003 Married White or Caucasian female here for annual exam.  [place]  She and husband are doing well.  Daughters are all doing fine.  Denies PCP.  Patient's last menstrual period was 12/28/2010.          Sexually active: Yes.    The current method of family planning is PMP.    Exercising: Yes.    Smoker:  no  Health Maintenance: Pap:  Neg HR HPV 06/2019 History of abnormal Pap:  no MMG:  08/20/2020 Colonoscopy:  08/25/13, follow up 10 years BMD:   Not due yet TDaP:  12/08/16 Pneumonia vaccine(s):  Not indicated Shingrix:   Discussed with pt Hep C testing: 12/18/2016 Screening Labs: plan fasting blood work year   reports that she has never smoked. She has never used smokeless tobacco. She reports current alcohol use of about 5.0 standard drinks of alcohol per week. She reports that she does not use drugs.  Past Medical History:  Diagnosis Date  . Constipation   . Environmental allergies   . Frequency of urination   . GERD (gastroesophageal reflux disease)    diet controlled -tx tums  . Hematuria   . Hiatal hernia   . History of gestational hypertension   . History of kidney stones   . History of pulmonary embolism 01/21/2011   bilateral -- RML, RLL, LLL  . OA (osteoarthritis)   . Pain in Achilles tendon    right  . Right ureteral stone   . Urgency of urination   . Uterine fibroid   . Wears glasses     Past Surgical History:  Procedure Laterality Date  . [place]  . COLONOSCOPY  11/12/2013  . CYSTOSCOPY/URETEROSCOPY/HOLMIUM LASER/STENT PLACEMENT Right 03/21/2017   Procedure: CYSTOSCOPY/ RETROGRADE/URETEROSCOPY/STENT PLACEMENT;  Surgeon: Ceasar Mons, MD;  Location: [facility];  Service: Urology;  Laterality: Right;  . CYSTOSCOPY/URETEROSCOPY/HOLMIUM LASER/STENT PLACEMENT Right 03/30/2017   Procedure: CYSTOSCOPY/URETEROSCOPY/HOLMIUM LASER/STENT EXCHANGE;  Surgeon: Ceasar Mons,  MD;  Location: [facility];  Service: Urology;  Laterality: Right;  . DILATATION & CURETTAGE/HYSTEROSCOPY WITH MYOSURE N/A 01/08/2017   Procedure: [place];  Surgeon: Megan Salon, MD;  Location: [facility];  Service: Gynecology;  Laterality: N/A;  . HOLMIUM LASER APPLICATION Right 07/31/4079   Procedure: HOLMIUM LASER APPLICATION;  Surgeon: Ceasar Mons, MD;  Location: [facility];  Service: Urology;  Laterality: Right;  . LAPAROSCOPIC CHOLECYSTECTOMY  1995  . STRABISMUS SURGERY Bilateral age 89  . TONSILLECTOMY  child  . TRANSTHORACIC ECHOCARDIOGRAM  02/07/2017   EF 55-60%  . WISDOM TOOTH EXTRACTION      Current Outpatient Medications  Medication Sig Dispense Refill  . Ascorbic Acid (VITAMIN C) 1000 MG tablet Take 1,000 mg by mouth daily.    . AZO-CRANBERRY PO Take by mouth.    . B Complex-C (SUPER B COMPLEX PO) Take 1 capsule by mouth daily.    . Cholecalciferol (VITAMIN D3) 2000 units TABS Take 2,000 Int'l Units by mouth daily.    . D-Mannose 500 MG CAPS     . EVENING PRIMROSE OIL PO Take 1,300 mg by mouth daily.     Marland Kitchen FLUoxetine (PROZAC) 10 MG tablet TAKE 1 TABLET BY MOUTH EVERY DAY 30 tablet 11  . Ginkgo Biloba 100 MG CAPS Take by mouth.    Marland Kitchen MAGNESIUM CITRATE PO Take 400 mg by mouth at bedtime.    Marland Kitchen  Multiple Vitamin (MULTIVITAMIN) capsule Take by mouth.    Ernestine Conrad 3-6-9 Fatty Acids (OMEGA-3-6-9 PO) Take 1,600 mg by mouth daily.    . Probiotic Product (PROBIOTIC PO) Take 1 capsule by mouth daily.    . Selenium 200 MCG CAPS     . ALPRAZolam (XANAX) 0.5 MG tablet TAKE 1 TABLET (0.5 MG TOTAL) BY MOUTH AT BEDTIME AS NEEDED FOR ANXIETY. (Patient not taking: Reported on 09/24/2020) 30 tablet 3   No current facility-administered medications for this visit.    Family History  Problem Relation Age of Onset  . Diabetes Father   . Hypertension Father   . Deep vein thrombosis Father   . Congestive Heart Failure  Father   . Lung cancer Mother   . Tuberculosis Mother        as a child  . Lymphoma Mother   . Non-Hodgkin's lymphoma Mother   . Skin cancer Maternal Grandmother   . Colon cancer Paternal Aunt        x2  . Heart attack Brother        half brother - 71  . Healthy Daughter   . Crohn's disease Daughter   . Allergies Daughter   . Breast cancer Neg Hx     Review of Systems  All other systems reviewed and are negative.   Exam:   BP 122/86 (BP Location: Left Arm, Patient Position: Sitting, Cuff Size: Large)   Pulse 64   Ht 5\' 1"  (1.549 m)   Wt 226 lb (102.5 kg)   LMP 12/28/2010   SpO2 99%   BMI 42.70 kg/m   Height: 5\' 1"  (154.9 cm)  General appearance: alert, cooperative and appears stated age Head: Normocephalic, without obvious abnormality, atraumatic Neck: no adenopathy, supple, symmetrical, trachea midline and thyroid normal to inspection and palpation Lungs: clear to auscultation bilaterally Breasts: normal appearance, no masses or tenderness Heart: regular rate and rhythm Abdomen: soft, non-tender; bowel sounds normal; no masses,  no organomegaly Extremities: extremities normal, atraumatic, no cyanosis or edema Skin: Skin color, texture, turgor normal. No rashes or lesions Lymph nodes: Cervical, supraclavicular, and axillary nodes normal. No abnormal inguinal nodes palpated Neurologic: Grossly normal   Pelvic: External genitalia:  no lesions              Urethra:  normal appearing urethra with no masses, tenderness or lesions              Bartholins and Skenes: normal                 Vagina: normal appearing vagina with normal color and discharge, no lesions              Cervix: no lesions              Pap taken: No. Bimanual Exam:  Uterus:  normal size, contour, position, consistency, mobility, non-tender              Adnexa: normal adnexa and no mass, fullness, tenderness               Rectovaginal: Confirms               Anus:  normal sphincter tone, no  lesions  Chaperone, Britt Bottom, CMA, was present for exam.  Assessment/Plan: 1. Well woman exam with routine gynecological exam - neg HR HPV 06/2019.  Repeat 2023. - MMG 08/20/2020 - colonoscopy 2015 - Plan BMD after 61 - vaccines reviewed - plan fasting blood work next  year  2. Family history of colon cancer (paternal aunt)  29. Postmenopausal - no HRT  4. History of pulmonary embolism  5. Dysphoric mood - on Fluoxitine 10mg  daily.  Rx done 07/2020 for entire year.

## 2020-09-28 ENCOUNTER — Other Ambulatory Visit: Payer: Self-pay | Admitting: Obstetrics & Gynecology

## 2020-10-23 ENCOUNTER — Other Ambulatory Visit: Payer: Self-pay | Admitting: Obstetrics & Gynecology

## 2021-07-27 ENCOUNTER — Other Ambulatory Visit: Payer: Self-pay | Admitting: Obstetrics & Gynecology

## 2021-07-27 DIAGNOSIS — Z1231 Encounter for screening mammogram for malignant neoplasm of breast: Secondary | ICD-10-CM

## 2021-08-23 ENCOUNTER — Other Ambulatory Visit: Payer: Self-pay

## 2021-08-23 ENCOUNTER — Ambulatory Visit
Admission: RE | Admit: 2021-08-23 | Discharge: 2021-08-23 | Disposition: A | Discharge status: Home / Self Care | Payer: 59 | Source: Ambulatory Visit | Attending: Obstetrics & Gynecology | Admitting: Obstetrics & Gynecology

## 2021-08-23 DIAGNOSIS — Z1231 Encounter for screening mammogram for malignant neoplasm of breast: Secondary | ICD-10-CM

## 2021-09-27 ENCOUNTER — Ambulatory Visit (HOSPITAL_BASED_OUTPATIENT_CLINIC_OR_DEPARTMENT_OTHER): Payer: 59 | Admitting: Obstetrics & Gynecology

## 2021-09-29 ENCOUNTER — Other Ambulatory Visit: Payer: Self-pay

## 2021-09-29 ENCOUNTER — Encounter (HOSPITAL_BASED_OUTPATIENT_CLINIC_OR_DEPARTMENT_OTHER): Payer: Self-pay | Admitting: Obstetrics & Gynecology

## 2021-09-29 ENCOUNTER — Other Ambulatory Visit (HOSPITAL_COMMUNITY)
Admission: RE | Admit: 2021-09-29 | Discharge: 2021-09-29 | Disposition: A | Discharge status: Home / Self Care | Payer: 59 | Source: Ambulatory Visit | Attending: Obstetrics & Gynecology | Admitting: Obstetrics & Gynecology

## 2021-09-29 ENCOUNTER — Ambulatory Visit (INDEPENDENT_AMBULATORY_CARE_PROVIDER_SITE_OTHER): Payer: 59 | Admitting: Obstetrics & Gynecology

## 2021-09-29 VITALS — BP 139/83 | HR 76 | Ht 61.0 in | Wt 248.2 lb

## 2021-09-29 DIAGNOSIS — R4589 Other symptoms and signs involving emotional state: Secondary | ICD-10-CM

## 2021-09-29 DIAGNOSIS — E2839 Other primary ovarian failure: Secondary | ICD-10-CM

## 2021-09-29 DIAGNOSIS — Z124 Encounter for screening for malignant neoplasm of cervix: Secondary | ICD-10-CM | POA: Diagnosis present

## 2021-09-29 DIAGNOSIS — Z86711 Personal history of pulmonary embolism: Secondary | ICD-10-CM

## 2021-09-29 DIAGNOSIS — Z01419 Encounter for gynecological examination (general) (routine) without abnormal findings: Secondary | ICD-10-CM | POA: Diagnosis not present

## 2021-09-29 DIAGNOSIS — Z0000 Encounter for general adult medical examination without abnormal findings: Secondary | ICD-10-CM | POA: Diagnosis not present

## 2021-09-29 MED ORDER — FLUOXETINE HCL 10 MG PO CAPS: 10.0000 mg | ORAL_CAPSULE | Freq: Every day | ORAL | 3 refills | Status: DC

## 2021-09-29 NOTE — Progress Notes (Signed)
61 y.o. G3P2003 Married White or Caucasian female here for annual exam.  Doing well.  Does need refill for fluoxetine.  Doing well with this dosage.  Denies vaginal bleeding.  Has not had blood work done recently.   ? ?Patient's last menstrual period was 12/28/2010.          ?Sexually active: Yes.    ?The current method of family planning is post menopausal status.    ?Smoker:  no ? ?Health Maintenance: ?Pap:  02/26/2018 Negative ?History of abnormal Pap:  no ?MMG:  08/23/2021 Negative ?Colonoscopy:  12/24/2013 ?BMD:   plan after next birthday ?Screening Labs: future orders placed to do fasting at [facility] ? ? reports that she has never smoked. She has never used smokeless tobacco. She reports current alcohol use of about 5.0 standard drinks per week. She reports that she does not use drugs. ? ?Past Medical History:  ?Diagnosis Date  ? Constipation   ? Environmental allergies   ? Frequency of urination   ? GERD (gastroesophageal reflux disease)   ? diet controlled -tx tums  ? Hematuria   ? Hiatal hernia   ? History of gestational hypertension   ? History of kidney stones   ? History of pulmonary embolism 01/21/2011  ? bilateral -- RML, RLL, LLL  ? OA (osteoarthritis)   ? Pain in Achilles tendon   ? right  ? Right ureteral stone   ? Urgency of urination   ? Uterine fibroid   ? Wears glasses   ? ? ?Past Surgical History:  ?Procedure Laterality Date  ? [place]  ? COLONOSCOPY  11/12/2013  ? CYSTOSCOPY/URETEROSCOPY/HOLMIUM LASER/STENT PLACEMENT Right 03/21/2017  ? Procedure: CYSTOSCOPY/ RETROGRADE/URETEROSCOPY/STENT PLACEMENT;  Surgeon: Ceasar Mons, MD;  Location: [facility];  Service: Urology;  Laterality: Right;  ? CYSTOSCOPY/URETEROSCOPY/HOLMIUM LASER/STENT PLACEMENT Right 03/30/2017  ? Procedure: CYSTOSCOPY/URETEROSCOPY/HOLMIUM LASER/STENT EXCHANGE;  Surgeon: Ceasar Mons, MD;  Location: [facility];  Service: Urology;  Laterality: Right;   ? DILATATION & CURETTAGE/HYSTEROSCOPY WITH MYOSURE N/A 01/08/2017  ? Procedure: [place];  Surgeon: Megan Salon, MD;  Location: [facility];  Service: Gynecology;  Laterality: N/A;  ? HOLMIUM LASER APPLICATION Right 09/01/9369  ? Procedure: HOLMIUM LASER APPLICATION;  Surgeon: Ceasar Mons, MD;  Location: [facility];  Service: Urology;  Laterality: Right;  ? [place]  ? STRABISMUS SURGERY Bilateral age 70  ? TONSILLECTOMY  child  ? TRANSTHORACIC ECHOCARDIOGRAM  02/07/2017  ? EF 55-60%  ? WISDOM TOOTH EXTRACTION    ? ? ?Current Outpatient Medications  ?Medication Sig Dispense Refill  ? Ascorbic Acid (VITAMIN C) 1000 MG tablet Take 1,000 mg by mouth daily.    ? AZO-CRANBERRY PO Take by mouth.    ? B Complex-C (SUPER B COMPLEX PO) Take 1 capsule by mouth daily.    ? Cholecalciferol (VITAMIN D3) 2000 units TABS Take 2,000 Int'l Units by mouth daily.    ? D-Mannose 500 MG CAPS     ? EVENING PRIMROSE OIL PO Take 1,300 mg by mouth daily.     ? FLUoxetine (PROZAC) 10 MG capsule TAKE 1 CAPSULE BY MOUTH EVERY DAY 90 capsule 4  ? Ginkgo Biloba 100 MG CAPS Take by mouth.    ? MAGNESIUM CITRATE PO Take 400 mg by mouth at bedtime.    ? Multiple Vitamin (MULTIVITAMIN) capsule Take by mouth.    ? Omega 3-6-9 Fatty Acids (OMEGA-3-6-9 PO)  Take 1,600 mg by mouth daily.    ? Probiotic Product (PROBIOTIC PO) Take 1 capsule by mouth daily.    ? Selenium 200 MCG CAPS     ? ?No current facility-administered medications for this visit.  ? ? ?Family History  ?Problem Relation Age of Onset  ? Diabetes Father   ? Hypertension Father   ? Deep vein thrombosis Father   ? Congestive Heart Failure Father   ? Lung cancer Mother   ? Tuberculosis Mother   ?     as a child  ? Lymphoma Mother   ? Non-Hodgkin's lymphoma Mother   ? Skin cancer Maternal Grandmother   ? Colon cancer Paternal Aunt   ?     x2  ? Heart attack Brother   ?     half brother - 77  ? Healthy  Daughter   ? Crohn's disease Daughter   ? Allergies Daughter   ? Breast cancer Neg Hx   ? ? ?Review of Systems  ?All other systems reviewed and are negative. ? ?Exam:   ?BP 139/83 (BP Location: Right Arm, Patient Position: Sitting, Cuff Size: Large)   Pulse 76   Ht '5\' 1"'$  (1.549 m) Comment: reported  Wt 248 lb 3.2 oz (112.6 kg)   LMP 12/28/2010   BMI 46.90 kg/m?   Height: '5\' 1"'$  (154.9 cm) (reported) ? ?General appearance: alert, cooperative and appears stated age ?Head: Normocephalic, without obvious abnormality, atraumatic ?Neck: no adenopathy, supple, symmetrical, trachea midline and thyroid normal to inspection and palpation ?Lungs: clear to auscultation bilaterally ?Breasts: normal appearance, no masses or tenderness ?Heart: regular rate and rhythm ?Abdomen: soft, non-tender; bowel sounds normal; no masses,  no organomegaly ?Extremities: extremities normal, atraumatic, no cyanosis or edema ?Skin: Skin color, texture, turgor normal. No rashes or lesions ?Lymph nodes: Cervical, supraclavicular, and axillary nodes normal. ?No abnormal inguinal nodes palpated ?Neurologic: Grossly normal ? ? ?Pelvic: External genitalia:  no lesions ?             Urethra:  normal appearing urethra with no masses, tenderness or lesions ?             Bartholins and Skenes: normal    ?             Vagina: normal appearing vagina with normal color and no discharge, no lesions ?             Cervix: no lesions ?             Pap taken: Yes.   ?Bimanual Exam:  Uterus:  normal size, contour, position, consistency, mobility, non-tender ?             Adnexa: normal adnexa and no mass, fullness, tenderness ?              Rectovaginal: Confirms ?              Anus:  normal sphincter tone, no lesions ? ?Chaperone, Octaviano Batty, CMA, was present for exam. ? ?Assessment/Plan: ?1. Well woman exam with routine gynecological exam ?- pap and HR HPV obtained today ?- MMG 07/2021 ?- colonoscopy 12/2013, follow up 10 years ?- BMD will be done after next  birthday ?- lab work ordered today as future labs to be done at [facility] ?- vaccines reviewed/updated ? ?2. Cervical cancer screening ?- Cytology - PAP( [place]) ? ?3. Hypoestrogenism ?- DG BONE DENSITY (DXA); Future ? ?4. Blood tests for routine general physical examination ?- CBC;  Future ?- Comprehensive metabolic panel; Future ?- Hemoglobin A1c; Future ?- TSH; Future ?- Lipid panel; Future ? ?5. Dysphoric mood ?- FLUoxetine (PROZAC) 10 MG capsule; Take 1 capsule (10 mg total) by mouth daily.  Dispense: 90 capsule; Refill: 3 ? ?6. History of pulmonary embolism ? ? ?

## 2021-09-30 ENCOUNTER — Encounter (HOSPITAL_BASED_OUTPATIENT_CLINIC_OR_DEPARTMENT_OTHER): Payer: Self-pay | Admitting: Obstetrics & Gynecology

## 2021-09-30 LAB — CYTOLOGY - PAP
Comment: NEGATIVE
Diagnosis: NEGATIVE
High risk HPV: NEGATIVE

## 2021-10-17 ENCOUNTER — Other Ambulatory Visit (HOSPITAL_BASED_OUTPATIENT_CLINIC_OR_DEPARTMENT_OTHER): Payer: Self-pay | Admitting: Obstetrics & Gynecology

## 2021-10-17 DIAGNOSIS — Z1231 Encounter for screening mammogram for malignant neoplasm of breast: Secondary | ICD-10-CM

## 2021-10-24 ENCOUNTER — Telehealth (HOSPITAL_BASED_OUTPATIENT_CLINIC_OR_DEPARTMENT_OTHER): Payer: Self-pay | Admitting: *Deleted

## 2021-10-24 NOTE — Telephone Encounter (Signed)
LMOVM for pt to call office to schedule labs. ?

## 2021-10-25 ENCOUNTER — Telehealth (HOSPITAL_BASED_OUTPATIENT_CLINIC_OR_DEPARTMENT_OTHER): Payer: Self-pay | Admitting: Obstetrics & Gynecology

## 2021-10-25 NOTE — Telephone Encounter (Signed)
Pt called back and is planning to go to a [facility] and get her bloodwork done tomorrow. ?

## 2021-10-25 NOTE — Telephone Encounter (Signed)
LMOVM for pt to return call 

## 2021-10-25 NOTE — Telephone Encounter (Signed)
Patient called and would like for Kim the nurse to please call her . 

## 2021-10-26 LAB — LIPID PANEL
Chol/HDL Ratio: 2.2 ratio (ref 0.0–4.4)
Cholesterol, Total: 207 mg/dL — ABNORMAL HIGH (ref 100–199)
HDL: 95 mg/dL (ref >39)
LDL Chol Calc (NIH): 96 mg/dL (ref 0–99)
Triglycerides: 91 mg/dL (ref 0–149)
VLDL Cholesterol Cal: 16 mg/dL (ref 5–40)

## 2021-10-26 LAB — COMPREHENSIVE METABOLIC PANEL
ALT: 16 IU/L (ref 0–32)
AST: 18 IU/L (ref 0–40)
Albumin/Globulin Ratio: 1.8 (ref 1.2–2.2)
Albumin: 4.8 g/dL (ref 3.8–4.9)
Alkaline Phosphatase: 86 IU/L (ref 44–121)
BUN/Creatinine Ratio: 13 (ref 9–23)
BUN: 13 mg/dL (ref 6–24)
Bilirubin Total: 0.4 mg/dL (ref 0.0–1.2)
CO2: 22 mmol/L (ref 20–29)
Calcium: 9.8 mg/dL (ref 8.7–10.2)
Chloride: 102 mmol/L (ref 96–106)
Creatinine, Ser: 1.01 mg/dL — ABNORMAL HIGH (ref 0.57–1.00)
Globulin, Total: 2.6 g/dL (ref 1.5–4.5)
Glucose: 95 mg/dL (ref 70–99)
Potassium: 4.7 mmol/L (ref 3.5–5.2)
Sodium: 140 mmol/L (ref 134–144)
Total Protein: 7.4 g/dL (ref 6.0–8.5)
eGFR: 64 mL/min/{1.73_m2} (ref >59)

## 2021-10-26 LAB — CBC
Hematocrit: 45.1 % (ref 34.0–46.6)
Hemoglobin: 15 g/dL (ref 11.1–15.9)
MCH: 29 pg (ref 26.6–33.0)
MCHC: 33.3 g/dL (ref 31.5–35.7)
MCV: 87 fL (ref 79–97)
Platelets: 205 10*3/uL (ref 150–450)
RBC: 5.17 x10E6/uL (ref 3.77–5.28)
RDW: 13.1 % (ref 11.7–15.4)
WBC: 5.6 10*3/uL (ref 3.4–10.8)

## 2021-10-26 LAB — TSH: TSH: 2.77 u[IU]/mL (ref 0.450–4.500)

## 2021-10-26 LAB — HEMOGLOBIN A1C
Est. average glucose Bld gHb Est-mCnc: 120 mg/dL
Hgb A1c MFr Bld: 5.8 % — ABNORMAL HIGH (ref 4.8–5.6)

## 2022-08-24 ENCOUNTER — Ambulatory Visit
Admission: RE | Admit: 2022-08-24 | Discharge: 2022-08-24 | Disposition: A | Discharge status: Home / Self Care | Payer: 59 | Source: Ambulatory Visit | Attending: Obstetrics & Gynecology | Admitting: Obstetrics & Gynecology

## 2022-08-24 DIAGNOSIS — Z1231 Encounter for screening mammogram for malignant neoplasm of breast: Secondary | ICD-10-CM

## 2022-08-24 DIAGNOSIS — E2839 Other primary ovarian failure: Secondary | ICD-10-CM

## 2022-10-02 ENCOUNTER — Ambulatory Visit (INDEPENDENT_AMBULATORY_CARE_PROVIDER_SITE_OTHER): Payer: 59 | Admitting: Obstetrics & Gynecology

## 2022-10-02 ENCOUNTER — Ambulatory Visit (HOSPITAL_BASED_OUTPATIENT_CLINIC_OR_DEPARTMENT_OTHER): Payer: 59 | Admitting: Obstetrics & Gynecology

## 2022-10-02 ENCOUNTER — Encounter (HOSPITAL_BASED_OUTPATIENT_CLINIC_OR_DEPARTMENT_OTHER): Payer: Self-pay | Admitting: Obstetrics & Gynecology

## 2022-10-02 VITALS — BP 130/84 | HR 62 | Ht 61.0 in | Wt 215.0 lb

## 2022-10-02 DIAGNOSIS — Z01419 Encounter for gynecological examination (general) (routine) without abnormal findings: Secondary | ICD-10-CM

## 2022-10-02 DIAGNOSIS — Z86711 Personal history of pulmonary embolism: Secondary | ICD-10-CM

## 2022-10-02 DIAGNOSIS — E785 Hyperlipidemia, unspecified: Secondary | ICD-10-CM | POA: Diagnosis not present

## 2022-10-02 DIAGNOSIS — R4589 Other symptoms and signs involving emotional state: Secondary | ICD-10-CM

## 2022-10-02 DIAGNOSIS — R7303 Prediabetes: Secondary | ICD-10-CM

## 2022-10-02 MED ORDER — FLUOXETINE HCL 10 MG PO CAPS: 10.0000 mg | ORAL_CAPSULE | Freq: Every day | ORAL | 3 refills | Status: DC

## 2022-10-02 NOTE — Progress Notes (Signed)
62 y.o. G67P2003 Married White or Caucasian female here for annual exam.  Denies vaginal bleeding.  Needs refill for fluoxetine.    Denies vaginal bleeding.    Has lost weight since last year.  She has been on phentermine.  She is exercising five days a wek.    Patient's last menstrual period was 12/28/2010.          Sexually active: Yes.    The current method of family planning is post menopausal status.    Smoker:  no  Health Maintenance: Pap:  09/2021, neg HR HPV History of abnormal Pap:  no MMG:  08/2022 Colonoscopy:  12/24/2013, follow up 10 years BMD:   08/2022, ostepenia Screening Labs: ordered today   reports that she has never smoked. She has never used smokeless tobacco. She reports current alcohol use of about 5.0 standard drinks of alcohol per week. She reports that she does not use drugs.  Past Medical History:  Diagnosis Date   Constipation    Environmental allergies    Frequency of urination    GERD (gastroesophageal reflux disease)    diet controlled -tx tums   Hematuria    Hiatal hernia    History of gestational hypertension    History of kidney stones    History of pulmonary embolism 01/21/2011   bilateral -- RML, RLL, LLL   OA (osteoarthritis)    Pain in Achilles tendon    right   Right ureteral stone    Urgency of urination    Uterine fibroid    Wears glasses     Past Surgical History:  Procedure Laterality Date   [place]   COLONOSCOPY  11/12/2013   CYSTOSCOPY/URETEROSCOPY/HOLMIUM LASER/STENT PLACEMENT Right 03/21/2017   Procedure: CYSTOSCOPY/ RETROGRADE/URETEROSCOPY/STENT PLACEMENT;  Surgeon: Ceasar Mons, MD;  Location: [facility];  Service: Urology;  Laterality: Right;   CYSTOSCOPY/URETEROSCOPY/HOLMIUM LASER/STENT PLACEMENT Right 03/30/2017   Procedure: CYSTOSCOPY/URETEROSCOPY/HOLMIUM LASER/STENT EXCHANGE;  Surgeon: Ceasar Mons, MD;  Location: [facility];  Service:  Urology;  Laterality: Right;   DILATATION & CURETTAGE/HYSTEROSCOPY WITH MYOSURE N/A 01/08/2017   Procedure: DILATATION & CURETTAGE/HYSTEROSCOPY WITH MYOSURE;  Surgeon: Megan Salon, MD;  Location: [facility];  Service: Gynecology;  Laterality: N/A;   HOLMIUM LASER APPLICATION Right [id number]   Procedure: HOLMIUM LASER APPLICATION;  Surgeon: Ceasar Mons, MD;  Location: [facility];  Service: Urology;  Laterality: Right;   LAPAROSCOPIC CHOLECYSTECTOMY  1995   STRABISMUS SURGERY Bilateral age 40   TONSILLECTOMY  child   TRANSTHORACIC ECHOCARDIOGRAM  02/07/2017   EF 55-60%   WISDOM TOOTH EXTRACTION      Current Outpatient Medications  Medication Sig Dispense Refill   Ascorbic Acid (VITAMIN C) 1000 MG tablet Take 1,000 mg by mouth daily.     AZO-CRANBERRY PO Take by mouth.     B Complex-C (SUPER B COMPLEX PO) Take 1 capsule by mouth daily.     Cholecalciferol (VITAMIN D3) 2000 units TABS Take 2,000 Int'l Units by mouth daily.     D-Mannose 500 MG CAPS      EVENING PRIMROSE OIL PO Take 1,300 mg by mouth daily.      FLUoxetine (PROZAC) 10 MG capsule Take 1 capsule (10 mg total) by mouth daily. 90 capsule 3   Ginkgo Biloba 100 MG CAPS Take by mouth.     MAGNESIUM CITRATE PO Take 400 mg by mouth at bedtime.     Multiple Vitamin (MULTIVITAMIN) capsule Take  by mouth.     Omega 3-6-9 Fatty Acids (OMEGA-3-6-9 PO) Take 1,600 mg by mouth daily.     phentermine 37.5 MG capsule Take 37.5 mg by mouth every morning.     Probiotic Product (PROBIOTIC PO) Take 1 capsule by mouth daily.     Selenium 200 MCG CAPS      No current facility-administered medications for this visit.    Family History  Problem Relation Age of Onset   Diabetes Father    Hypertension Father    Deep vein thrombosis Father    Congestive Heart Failure Father    Lung cancer Mother    Tuberculosis Mother        as a child   Lymphoma Mother    Non-Hodgkin's lymphoma Mother    Skin cancer Maternal  Grandmother    Colon cancer Paternal Aunt        x2   Heart attack Brother        half brother - 45   Healthy Daughter    Crohn's disease Daughter    Allergies Daughter    Breast cancer Neg Hx     ROS: Constitutional: negative Genitourinary:negative  Exam:   BP 130/84 (BP Location: Left Arm, Patient Position: Sitting, Cuff Size: Large)   Pulse 62   Ht '5\' 1"'$  (1.549 m) Comment: Reported  Wt 215 lb (97.5 kg)   LMP 12/28/2010   BMI 40.62 kg/m   Height: '5\' 1"'$  (154.9 cm) (Reported)  General appearance: alert, cooperative and appears stated age Head: Normocephalic, without obvious abnormality, atraumatic Neck: no adenopathy, supple, symmetrical, trachea midline and thyroid normal to inspection and palpation Lungs: clear to auscultation bilaterally Breasts: normal appearance, no masses or tenderness Heart: regular rate and rhythm Abdomen: soft, non-tender; bowel sounds normal; no masses,  no organomegaly Extremities: extremities normal, atraumatic, no cyanosis or edema Skin: Skin color, texture, turgor normal. No rashes or lesions Lymph nodes: Cervical, supraclavicular, and axillary nodes normal. No abnormal inguinal nodes palpated Neurologic: Grossly normal   Pelvic: External genitalia:  no lesions              Urethra:  normal appearing urethra with no masses, tenderness or lesions              Bartholins and Skenes: normal                 Vagina: normal appearing vagina with normal color and no discharge, no lesions              Cervix: no lesions              Pap taken: No. Bimanual Exam:  Uterus:  normal size, contour, position, consistency, mobility, non-tender              Adnexa: normal adnexa and no mass, fullness, tenderness               Rectovaginal: Confirms               Anus:  normal sphincter tone, no lesions  Chaperone, Octaviano Batty, CMA, was present for exam.  Assessment/Plan: 1. Well woman exam with routine gynecological exam - Pap smear with neg HR HPV  2023 - Mammogram 08/2022  - Colonoscopy 2015, follow up 10 years - Bone mineral density 2/204 - lab work ordered - vaccines reviewed/updated  2. Dysphoric mood - FLUoxetine (PROZAC) 10 MG capsule; Take 1 capsule (10 mg total) by mouth daily.  Dispense: 90 capsule; Refill: 3  3. Elevated lipids  4. Prediabetes - Lipid panel; Future - Hemoglobin A1c; Future - Comprehensive metabolic panel; Future  5. History of pulmonary embolism - off all medications

## 2022-10-11 ENCOUNTER — Other Ambulatory Visit (HOSPITAL_BASED_OUTPATIENT_CLINIC_OR_DEPARTMENT_OTHER): Payer: Self-pay

## 2022-10-12 ENCOUNTER — Other Ambulatory Visit (HOSPITAL_BASED_OUTPATIENT_CLINIC_OR_DEPARTMENT_OTHER): Payer: 59

## 2022-10-12 DIAGNOSIS — R7303 Prediabetes: Secondary | ICD-10-CM

## 2022-10-13 LAB — COMPREHENSIVE METABOLIC PANEL
ALT: 15 IU/L (ref 0–32)
AST: 17 IU/L (ref 0–40)
Albumin/Globulin Ratio: 1.9 (ref 1.2–2.2)
Albumin: 4.4 g/dL (ref 3.8–4.9)
Alkaline Phosphatase: 92 IU/L (ref 44–121)
BUN/Creatinine Ratio: 20 (ref 12–28)
BUN: 19 mg/dL (ref 8–27)
Bilirubin Total: 0.5 mg/dL (ref 0.0–1.2)
CO2: 21 mmol/L (ref 20–29)
Calcium: 9.2 mg/dL (ref 8.7–10.3)
Chloride: 105 mmol/L (ref 96–106)
Creatinine, Ser: 0.97 mg/dL (ref 0.57–1.00)
Globulin, Total: 2.3 g/dL (ref 1.5–4.5)
Glucose: 92 mg/dL (ref 70–99)
Potassium: 4.3 mmol/L (ref 3.5–5.2)
Sodium: 141 mmol/L (ref 134–144)
Total Protein: 6.7 g/dL (ref 6.0–8.5)
eGFR: 67 mL/min/{1.73_m2} (ref >59)

## 2022-10-13 LAB — LIPID PANEL
Chol/HDL Ratio: 2.1 ratio (ref 0.0–4.4)
Cholesterol, Total: 190 mg/dL (ref 100–199)
HDL: 90 mg/dL (ref >39)
LDL Chol Calc (NIH): 88 mg/dL (ref 0–99)
Triglycerides: 63 mg/dL (ref 0–149)
VLDL Cholesterol Cal: 12 mg/dL (ref 5–40)

## 2022-10-13 LAB — HEMOGLOBIN A1C
Est. average glucose Bld gHb Est-mCnc: 117 mg/dL
Hgb A1c MFr Bld: 5.7 % — ABNORMAL HIGH (ref 4.8–5.6)

## 2023-06-03 ENCOUNTER — Encounter (HOSPITAL_BASED_OUTPATIENT_CLINIC_OR_DEPARTMENT_OTHER): Payer: Self-pay | Admitting: Obstetrics & Gynecology

## 2023-06-06 ENCOUNTER — Encounter (HOSPITAL_BASED_OUTPATIENT_CLINIC_OR_DEPARTMENT_OTHER): Payer: Self-pay

## 2023-06-06 ENCOUNTER — Telehealth (HOSPITAL_BASED_OUTPATIENT_CLINIC_OR_DEPARTMENT_OTHER): Payer: 59 | Admitting: Obstetrics & Gynecology

## 2023-06-06 ENCOUNTER — Other Ambulatory Visit (HOSPITAL_BASED_OUTPATIENT_CLINIC_OR_DEPARTMENT_OTHER): Payer: Self-pay | Admitting: Obstetrics & Gynecology

## 2023-06-06 DIAGNOSIS — R4589 Other symptoms and signs involving emotional state: Secondary | ICD-10-CM

## 2023-06-06 DIAGNOSIS — F419 Anxiety disorder, unspecified: Secondary | ICD-10-CM | POA: Diagnosis not present

## 2023-06-06 MED ORDER — FLUOXETINE HCL 10 MG PO TABS: 15.0000 mg | ORAL_TABLET | Freq: Every day | ORAL | 1 refills | Status: DC

## 2023-06-07 ENCOUNTER — Other Ambulatory Visit (HOSPITAL_BASED_OUTPATIENT_CLINIC_OR_DEPARTMENT_OTHER): Payer: Self-pay | Admitting: *Deleted

## 2023-06-08 ENCOUNTER — Other Ambulatory Visit (HOSPITAL_BASED_OUTPATIENT_CLINIC_OR_DEPARTMENT_OTHER): Payer: Self-pay | Admitting: Obstetrics & Gynecology

## 2023-06-08 DIAGNOSIS — R4589 Other symptoms and signs involving emotional state: Secondary | ICD-10-CM

## 2023-06-08 MED ORDER — FLUOXETINE HCL 10 MG PO TABS: 15.0000 mg | ORAL_TABLET | Freq: Every day | ORAL | 1 refills | Status: DC

## 2023-06-09 ENCOUNTER — Encounter (HOSPITAL_BASED_OUTPATIENT_CLINIC_OR_DEPARTMENT_OTHER): Payer: Self-pay | Admitting: Obstetrics & Gynecology

## 2023-06-09 NOTE — Progress Notes (Signed)
Virtual Visit via Video Note  I connected with Sharon Page on 06/09/23 at  4:15 PM EST by a video enabled telemedicine application and verified that I am speaking with the correct person using two identifiers.  Location: Patient: home Provider: office   I discussed the limitations of evaluation and management by telemedicine and the availability of in person appointments. The patient expressed understanding and agreed to proceed.  History of Present Illness: 68 you G2p003 MWF having virtual visit to discuss anxiety she's experienced during the most recent election cycle and now with completion of the election.  She is just worried about what the next few years will bring and she is feeling very isolated with her feelings.  [place] just [place]'t feel like there are many individuals who are like minded near her.  We discussed possible therapy and she is open to this but would prefer a provider who aligns with her political leaning.  She has been on fluoxetine 10mg .  We discussed increasing and she feels comfortable with this.  Would like to make changes slowly so would like to increase to 15 mg.  Will need to switch to tablets.  She is comfortable with this.   Observations/Objective: WNWDWF NAD  Assessment and Plan: 1. Anxiety - will increase fluoxetine to 15mg .  This will need to be 1 1/2 tablets daily.  Rx to pharmacy. - will make some inquiries about possibly therapist for pt  Follow Up Instructions: I discussed the assessment and treatment plan with the patient. The patient was provided an opportunity to ask questions and all were answered. The patient agreed with the plan and demonstrated an understanding of the instructions.   The patient was advised to call back or seek an in-person evaluation if the symptoms worsen or if the condition fails to improve as anticipated.  I provided 21 minutes of non-face-to-face time during this encounter.   Jerene Bears, MD

## 2023-07-04 ENCOUNTER — Encounter (HOSPITAL_BASED_OUTPATIENT_CLINIC_OR_DEPARTMENT_OTHER): Payer: Self-pay | Admitting: Obstetrics & Gynecology

## 2023-07-04 ENCOUNTER — Other Ambulatory Visit (HOSPITAL_BASED_OUTPATIENT_CLINIC_OR_DEPARTMENT_OTHER): Payer: Self-pay | Admitting: Obstetrics & Gynecology

## 2023-07-04 DIAGNOSIS — R4589 Other symptoms and signs involving emotional state: Secondary | ICD-10-CM

## 2023-07-04 MED ORDER — FLUOXETINE HCL 10 MG PO TABS: 15.0000 mg | ORAL_TABLET | Freq: Every day | ORAL | 3 refills | Status: DC

## 2023-07-27 ENCOUNTER — Encounter (HOSPITAL_BASED_OUTPATIENT_CLINIC_OR_DEPARTMENT_OTHER): Payer: Self-pay | Admitting: Obstetrics & Gynecology

## 2023-07-27 ENCOUNTER — Other Ambulatory Visit: Payer: Self-pay | Admitting: Obstetrics & Gynecology

## 2023-07-27 DIAGNOSIS — Z1231 Encounter for screening mammogram for malignant neoplasm of breast: Secondary | ICD-10-CM

## 2023-08-14 ENCOUNTER — Ambulatory Visit: Payer: 59 | Admitting: Psychology

## 2023-08-14 DIAGNOSIS — F4323 Adjustment disorder with mixed anxiety and depressed mood: Secondary | ICD-10-CM

## 2023-08-14 NOTE — Progress Notes (Signed)
[facility] Behavioral Health Counselor Initial Adult Exam  Name: Sharon Page Date: 08/14/2023 MRN: [id number] DOB: 1960-08-15 PCP: Elsie Saas, FNP  Time spent: 60 minutes  Guardian/Payee:  self  Paperwork requested: No   Reason for Visit /Presenting Problem: The patient was seen via video visit for the initial diagnostic evaluation.  The patient was in her home alone and the therapist was in the office.  The patient gave verbal consent for the session to be on caregility.  The patient was referred by Dr. Corrie Mckusick for therapy related to learning new coping strategies to.  The patient is having some issues with anxiety and depression related to politics.  Mental Status Exam: Appearance:   Casual     Behavior:  Appropriate  Motor:  Normal  Speech/Language:   Clear and Coherent  Affect:  Blunt  Mood:  sad  Thought process:  normal  Thought content:    WNL  Sensory/Perceptual disturbances:    WNL  Orientation:  oriented to person, place, time/date, and situation  Attention:  Good  Concentration:  Good  Memory:  WNL  Fund of knowledge:   Good  Insight:    Good  Judgment:   Good  Impulse Control:  Good     Reported Symptoms: The patient reports that she is having some difficulty with anxiety and depression related to coping with the election and the new administration.  Risk Assessment: Danger to Self:  No Self-injurious Behavior: No Danger to Others: No Duty to Warn:no Physical Aggression / Violence:No  Access to Firearms a concern: No  Gang Involvement:No  Patient / guardian was educated about steps to take if suicide or homicide risk level increases between visits: no While future psychiatric events cannot be accurately predicted, the patient does not currently require acute inpatient psychiatric care and does not currently meet [facility] involuntary commitment criteria.  Substance Abuse History: Current substance abuse: No     Past Psychiatric  History:   No previous psychological problems have been observed Outpatient Providers:none History of Psych Hospitalization: No  Psychological Testing: N/A  Abuse History:  Victim of: No.,  N/A    Report needed: No. Victim of Neglect:No. Perpetrator of N/A Witness / Exposure to Domestic Violence: No   Protective Services Involvement: No  Witness to [employer] Violence:  No   Family History:  Family History  Problem Relation Age of Onset   Diabetes Father    Hypertension Father    Deep vein thrombosis Father    Congestive Heart Failure Father    Lung cancer Mother    Tuberculosis Mother        as a child   Lymphoma Mother    Non-Hodgkin's lymphoma Mother    Skin cancer Maternal Grandmother    Colon cancer Paternal Aunt        x2   Heart attack Brother        half brother - 54   Healthy Daughter    Crohn's disease Daughter    Allergies Daughter    Breast cancer Neg Hx     Living situation: the patient lives with their spouse  Sexual Orientation: Straight  Relationship Status: married  Name of spouse / other:Unknow If a parent, number of children / ages:Three grown children, 35 and twins who are 30  Support Systems: spouse  Surveyor, quantity Stress:  No   Income/Employment/Disability: Employment  Financial planner: No   Educational History: Education: Water quality scientist: Protestant  Any cultural differences  that may affect / interfere with treatment:  not applicable   Recreation/Hobbies: Unknown  Stressors: Marital or family conflict   Other: The patient reports a lot of stress related to the election and the new administration    Strengths: Supportive Relationships, Self Advocate, and Able to Communicate Effectively  Barriers:  [place] one [place]  Legal History: Pending legal issue / charges: The patient has no significant history of legal issues. History of legal issue / charges: N/A  Medical  History/Surgical History: reviewed Past Medical History:  Diagnosis Date   Constipation    Environmental allergies    Frequency of urination    GERD (gastroesophageal reflux disease)    diet controlled -tx tums   Hematuria    Hiatal hernia    History of gestational hypertension    History of kidney stones    History of pulmonary embolism 01/21/2011   bilateral -- RML, RLL, LLL   OA (osteoarthritis)    Pain in Achilles tendon    right   Right ureteral stone    Urgency of urination    Uterine fibroid    Wears glasses     Past Surgical History:  Procedure Laterality Date   CESAREAN SECTION  1990 and 1995   COLONOSCOPY  11/12/2013   CYSTOSCOPY/URETEROSCOPY/HOLMIUM LASER/STENT PLACEMENT Right 03/21/2017   Procedure: CYSTOSCOPY/ RETROGRADE/URETEROSCOPY/STENT PLACEMENT;  Surgeon: Rene Paci, MD;  Location: [facility];  Service: Urology;  Laterality: Right;   CYSTOSCOPY/URETEROSCOPY/HOLMIUM LASER/STENT PLACEMENT Right 03/30/2017   Procedure: CYSTOSCOPY/URETEROSCOPY/HOLMIUM LASER/STENT EXCHANGE;  Surgeon: Rene Paci, MD;  Location[facility];  Service: Urology;  Laterality: Right;   DILATATION & CURETTAGE/HYSTEROSCOPY WITH MYOSURE N/A 01/08/2017   Procedure: DILATATION & CURETTAGE/HYSTEROSCOPY WITH MYOSURE;  Surgeon: Jerene Bears, MD;  Location: [facility];  Service: Gynecology;  Laterality: N/A;   HOLMIUM LASER APPLICATION Right 03/30/2017   Procedure: HOLMIUM LASER APPLICATION;  Surgeon: Rene Paci, MD;  [facility];  Service: Urology;  Laterality: Right;   LAPAROSCOPIC CHOLECYSTECTOMY  1995   STRABISMUS SURGERY Bilateral age 72   TONSILLECTOMY  child   TRANSTHORACIC ECHOCARDIOGRAM  02/07/2017   EF 55-60%   WISDOM TOOTH EXTRACTION      Medications: Current Outpatient Medications  Medication Sig Dispense Refill   Ascorbic Acid (VITAMIN C) 1000 MG tablet Take 1,000 mg by mouth daily.      AZO-CRANBERRY PO Take by mouth.     B Complex-C (SUPER B COMPLEX PO) Take 1 capsule by mouth daily.     Cholecalciferol (VITAMIN D3) 2000 units TABS Take 2,000 Int'l Units by mouth daily.     D-Mannose 500 MG CAPS      EVENING PRIMROSE OIL PO Take 1,300 mg by mouth daily.      FLUoxetine (PROZAC) 10 MG tablet Take 1.5 tablets (15 mg total) by mouth daily. 135 tablet 3   Ginkgo Biloba 100 MG CAPS Take by mouth.     MAGNESIUM CITRATE PO Take 400 mg by mouth at bedtime.     Multiple Vitamin (MULTIVITAMIN) capsule Take by mouth.     Omega 3-6-9 Fatty Acids (OMEGA-3-6-9 PO) Take 1,600 mg by mouth daily.     Probiotic Product (PROBIOTIC PO) Take 1 capsule by mouth daily.     Selenium 200 MCG CAPS      No current facility-administered medications for this visit.    Allergies  Allergen Reactions   Ciprofloxacin Hcl Other (  See Comments)    Agitation    Codeine Other (See Comments)    agitation   Dilaudid [Hydromorphone Hcl] Other (See Comments)    Agitation    Morphine And Codeine Other (See Comments)    Agitation     Diagnoses:  Adjustment disorder with mixed anxiety and depressed mood  Plan of Care: The patient will benefit from individual therapy every two weeks for a 60 minute session to talk about issues and learn new coping strategies.  She likely will not need therapy for a long period of time, but we will look at reframing her thoughts and help her manage her emotions better.  We will look at a target date of being finished by 01/12/2024.   Beadie Matsunaga G Babbette Dalesandro, LCSW

## 2023-08-27 ENCOUNTER — Ambulatory Visit
Admission: RE | Admit: 2023-08-27 | Discharge: 2023-08-27 | Disposition: A | Discharge status: Home / Self Care | Payer: Commercial Managed Care - HMO | Source: Ambulatory Visit | Attending: Obstetrics & Gynecology | Admitting: Obstetrics & Gynecology

## 2023-08-27 DIAGNOSIS — Z1231 Encounter for screening mammogram for malignant neoplasm of breast: Secondary | ICD-10-CM

## 2023-09-04 ENCOUNTER — Ambulatory Visit: Payer: 59 | Admitting: Psychology

## 2023-09-04 DIAGNOSIS — F4323 Adjustment disorder with mixed anxiety and depressed mood: Secondary | ICD-10-CM | POA: Diagnosis not present

## 2023-09-04 NOTE — Progress Notes (Signed)
[facility] Counselor/Therapist Progress Note  Patient ID: Tamula Morrical, MRN: [id number],    Date: 09/04/2023  Time Spent: 55 minutes  Time in: 10:06  Time out:11:01  Treatment Type: Individual Therapy  Reported Symptoms: anxiety about the election and our country  Mental Status Exam: Appearance:  Casual     Behavior: Appropriate  Motor: Normal  Speech/Language:  Clear and Coherent  Affect: Blunt  Mood: anxious  Thought process: normal  Thought content:   WNL  Sensory/Perceptual disturbances:   WNL  Orientation: oriented to person, place, time/date, and situation  Attention: Good  Concentration: Good  Memory: WNL  Fund of knowledge:  Good  Insight:   Good  Judgment:  Good  Impulse Control: Good   Risk Assessment: Danger to Self:  No Self-injurious Behavior: No Danger to Others: No Duty to Warn:no Physical Aggression / Violence:No  Access to Firearms a concern: No  Gang Involvement:No   Subjective: The patient attended an individual therapy session being video visit today.  The patient gave verbal consent for this session to be on caregility.  The patient is aware of the limitations of telehealth.  We started a little bit late because she was in the system as an person and we ended up having to switch it to a virtual as she is not at that available to be in the office.  The patient presents as pleasant and cooperative.  The patient reports that she has been trying to do mindfulness and meditation since I saw her last and she has been struggling a little with that.  She and her daughters are going to go to [place] tomorrow and she is concerned that her daughters will bring up the political situation.  We talked about different ways to change the conversation and to help them not be so anxious along with her.  I recommended they take a break from social media for a while and that may do some activities together that are mindful and meditative.  The  patient felt that those suggestions were very good and she did report that she had started reading the book I recommended and I told her some exercises that she could do also that would be very relaxing for she and her daughters.  The patient does realize that she is going to have to change her narrative in her head that something more neutral and we talked about how to do that.  Interventions: Cognitive Behavioral Therapy, Mindfulness Meditation, Insight-Oriented, and Interpersonal  Diagnosis:Adjustment disorder with mixed anxiety and depressed mood  Plan:Client Abilities/Strengths  Intelligent, supportive husband , motivated, insightful  Client Treatment Preferences  Outpatient Individual therapy  Client Statement of Needs  "I need some help with my anxiety."  Treatment Level  Outpatient Individual therapy  Symptoms  Excessive and/or unrealistic worry that is difficult to control occurring more days than not for at least 6  months about a number of events or activities.:  (Status: maintained).  Hypervigilance (e.g., feeling constantly on edge,  experiencing concentration difficulties, having trouble falling or staying asleep, exhibiting a general  state of irritability).: No Description Entered (Status: maintained).  Problems Addressed Anxiety Objective  1. Stabilize anxiety level while increasing ability to function on a daily  basis. Objective Learn and implement problem-solving strategies for realistically addressing worries. Target Date: 09/03/2024 Frequency: biWeekly Progress: 0 Modality: individual Related Interventions 1. Teach the client problem-solving strategies involving specifically defining a problem,  generating options for addressing it, evaluating the pros  and cons of each option, selecting and  implementing an optional action, and reevaluating and refining the action  F41.1 (Generalized anxiety disorder) - Open - [Signifier: n/a] Generalized Anxiety   Disorder Conditions For Discharge Achievement of treatment goals and objectives    Rielle Schlauch G Corie Vavra, LCSW

## 2023-09-18 ENCOUNTER — Ambulatory Visit: Payer: 59 | Admitting: Psychology

## 2023-10-02 ENCOUNTER — Ambulatory Visit: Payer: 59 | Admitting: Psychology

## 2023-10-02 DIAGNOSIS — F4323 Adjustment disorder with mixed anxiety and depressed mood: Secondary | ICD-10-CM | POA: Diagnosis not present

## 2023-10-02 NOTE — Progress Notes (Unsigned)
   [facility] Counselor/Therapist Progress Note  Patient ID: Sharon Page, MRN: [id number],    Date: 10/02/2023  Time Spent: 53 minutes  Time in: 4:08 Time out:5:01  Treatment Type: Individual Therapy  Reported Symptoms: anxiety about the election and our country  Mental Status Exam: Appearance:  Casual     Behavior: Appropriate  Motor: Normal  Speech/Language:  Clear and Coherent  Affect: Blunt  Mood: Pleasant  Thought process: normal  Thought content:   WNL  Sensory/Perceptual disturbances:   WNL  Orientation: oriented to person, place, time/date, and situation  Attention: Good  Concentration: Good  Memory: WNL  Fund of knowledge:  Good  Insight:   Good  Judgment:  Good  Impulse Control: Good   Risk Assessment: Danger to Self:  No Self-injurious Behavior: No Danger to Others: No Duty to Warn:no Physical Aggression / Violence:No  Access to Firearms a concern: No  Gang Involvement:No   Subjective: The patient attended an individual therapy session being video visit today.  The patient gave verbal consent for this session to be on caregility.  The patient is aware of the limitations of telehealth.   The patient was in her car alone and the therapist was in the office.  The patient reports that she feels like she is doing much better with the situation that we have started working on.  She states that she is doing meditation and mindfulness and we talked about exercise being a good thing for her to offload her stress as well.  She has changed the way she looks at things and we did talk about some more strategies that she could use to manage her stress level better around the changes that are happening in government.  The patient feels like she is doing really well now and she does not feel like she needs to schedule any sessions right now so we are going to not schedule anything and she is aware that she can call me if she needs to and get on my schedule  again if needed.  Interventions: Cognitive Behavioral Therapy, Mindfulness Meditation, Insight-Oriented, and Interpersonal  Diagnosis:Adjustment disorder with mixed anxiety and depressed mood  Plan:Client Abilities/Strengths  Intelligent, supportive husband , motivated, insightful  Client Treatment Preferences  Outpatient Individual therapy  Client Statement of Needs  "I need some help with my anxiety."  Treatment Level  Outpatient Individual therapy  Symptoms  Excessive and/or unrealistic worry that is difficult to control occurring more days than not for at least 6  months about a number of events or activities.:  (Status: maintained).  Hypervigilance (e.g., feeling constantly on edge,  experiencing concentration difficulties, having trouble falling or staying asleep, exhibiting a general  state of irritability).: No Description Entered (Status: maintained).  Problems Addressed Anxiety Objective  1. Stabilize anxiety level while increasing ability to function on a daily  basis. Objective Learn and implement problem-solving strategies for realistically addressing worries. Target Date: 09/03/2024 Frequency: biWeekly Progress: 60 Modality: individual Related Interventions 1. Teach the client problem-solving strategies involving specifically defining a problem,  generating options for addressing it, evaluating the pros and cons of each option, selecting and  implementing an optional action, and reevaluating and refining the action  F41.1 (Generalized anxiety disorder) - Open - [Signifier: n/a] Generalized Anxiety  Disorder Conditions For Discharge Achievement of treatment goals and objectives    Zadia Uhde G Arath Kaigler, LCSW

## 2023-10-08 ENCOUNTER — Encounter (HOSPITAL_BASED_OUTPATIENT_CLINIC_OR_DEPARTMENT_OTHER): Payer: Self-pay | Admitting: Obstetrics & Gynecology

## 2023-10-08 ENCOUNTER — Ambulatory Visit (INDEPENDENT_AMBULATORY_CARE_PROVIDER_SITE_OTHER): Payer: 59 | Admitting: Obstetrics & Gynecology

## 2023-10-08 VITALS — BP 128/78 | HR 73 | Ht 60.5 in | Wt 227.2 lb

## 2023-10-08 DIAGNOSIS — Z780 Asymptomatic menopausal state: Secondary | ICD-10-CM | POA: Diagnosis not present

## 2023-10-08 DIAGNOSIS — Z800 Family history of malignant neoplasm of digestive organs: Secondary | ICD-10-CM | POA: Diagnosis not present

## 2023-10-08 DIAGNOSIS — Z01419 Encounter for gynecological examination (general) (routine) without abnormal findings: Secondary | ICD-10-CM

## 2023-10-08 DIAGNOSIS — F4321 Adjustment disorder with depressed mood: Secondary | ICD-10-CM | POA: Diagnosis not present

## 2023-10-08 DIAGNOSIS — M25562 Pain in left knee: Secondary | ICD-10-CM

## 2023-10-08 NOTE — Patient Instructions (Signed)
[facility] at [facility] [place], [place] [address] [phone]

## 2023-10-08 NOTE — Progress Notes (Signed)
 ANNUAL EXAM Patient name: Sharon Page MRN [id number]  Date of birth: 12/15/1960 Chief Complaint:   Breast and Pelvic  History of Present Illness:   Sharon Page is a 63 y.o. [id number] Caucasian female being seen today for a routine annual exam.  She is seeing Bambi Cottle.  Taking 10mg  fluoxetine daily.    She was recently diagnosed with microscopic colitis.  Was treated with desonide.  Repeat colonoscopy in 5 years.    Denies vaginal bleeding.    Reports staying in [place] at an air B&B that had concrete floors. H/o left knee meniscus tear.  Did PT then.  Now having a fair amount of pain in the left knee.    Patient's last menstrual period was 12/28/2010.   Last pap 09/29/2021. Results were: NILM w/ HRHPV negative. H/O abnormal pap: yes Last mammogram: 08/27/2023. Results were: normal. Family h/o breast cancer: no Last colonoscopy: 08/07/2023. Results were: abnormal microscopic colitis . Family h/o colorectal cancer: yes : two paternal aunts Dexa: -1.4.  mild osteopenia.       10/08/2023    2:05 PM 10/02/2022    3:22 PM 09/29/2021    2:50 PM 04/25/2018    8:34 AM 01/23/2017    8:30 AM  Depression screen PHQ 2/9  Decreased Interest 0 0 0 0 0  Down, Depressed, Hopeless 0 0 0 0 0  PHQ - 2 Score 0 0 0 0 0  Altered sleeping    0   Tired, decreased energy    0   Change in appetite    0   Feeling bad or failure about yourself     0   Trouble concentrating    0   Moving slowly or fidgety/restless    0   Suicidal thoughts    0   PHQ-9 Score    0   Difficult doing work/chores    Not difficult at all     Review of Systems:   Pertinent items are noted in HPI  Denies any vaginal bleeding, vaginal bleeding, pelvic pain and abdominal pain.  Denies urinary symptoms.  Loose stools have resolved.   Pertinent History Reviewed:  Reviewed past medical,surgical, social and family history.   Reviewed problem list, medications and allergies. Physical Assessment:   Vitals:    10/08/23 1404 10/08/23 1439  BP: (!) 147/90 128/78  Pulse: 73   Weight: 227 lb 3.2 oz (103.1 kg)   Height: 5' 0.5" (1.537 m)   Body mass index is 43.64 kg/m.        Physical Examination:   General appearance - well appearing, and in no distress  Mental status - alert, oriented to person, place, and time  Psych:  She has a normal mood and affect  Skin - warm and dry, normal color, no suspicious lesions noted  Chest - effort normal, all lung fields clear to auscultation bilaterally  Heart - normal rate and regular rhythm  Neck:  midline trachea, no thyromegaly or nodules  Breasts - breasts appear normal, no suspicious masses, no skin or nipple changes or  axillary nodes  Abdomen - soft, nontender, nondistended, no masses or organomegaly  Pelvic - VULVA: normal appearing vulva with no masses, tenderness or lesions   VAGINA: normal appearing vagina with normal color and discharge, no lesions   CERVIX: normal appearing cervix without discharge or lesions, no CMT  Thin prep pap is not don.  UTERUS: uterus is felt to be normal size, shape, consistency and nontender  ADNEXA: No adnexal masses or tenderness noted.  Rectal - normal rectal, good sphincter tone, no masses felt.   Extremities:  No swelling or varicosities noted  Chaperone present for exam, Ina Homes, CMA.    Assessment & Plan:  1. Well woman exam with routine gynecological exam (Primary) - Pap smear neg with neg HR HPV 2023 - Mammogram 08/2023 - Colonoscopy 16109 with follow up 5 years - Bone mineral density 2024 - lab work planned with Dr. Lennice Sites - vaccines reviewed/updated  2. Postmenopausal - not on HRT  3. Family history of colon cancer - colonoscopy follow up recommended in 5 years  4. Adjustment disorder with depressed mood - on Fluoxetine 15mg  daily.  Rx done 06/2024. - seeing Bambi Cottle   Meds: No orders of the defined types were placed in this encounter.   Follow-up: Return in about 1 year (around  10/07/2024).  Jerene Bears, MD 10/08/2023 2:39 PM

## 2023-10-11 ENCOUNTER — Other Ambulatory Visit (HOSPITAL_BASED_OUTPATIENT_CLINIC_OR_DEPARTMENT_OTHER): Payer: Self-pay | Admitting: Obstetrics & Gynecology

## 2023-10-11 DIAGNOSIS — R4589 Other symptoms and signs involving emotional state: Secondary | ICD-10-CM

## 2023-10-11 NOTE — Telephone Encounter (Signed)
 Called and spoke with pt in regards to rx refill. Pt stated Dr. Hyacinth Meeker changed dose to 15 mg. I let pt know I will consult with Dr. Hyacinth Meeker in regards to this.

## 2023-10-11 NOTE — Telephone Encounter (Signed)
 Sherie, Can you call the pharmacy.  I did a prescription RF for this in December with RFs for a year for the 69m dosage.  Not sure why we are getting this refill request.  She should be good with refills.  Thank you!  Dr. Hyacinth Meeker

## 2023-11-19 ENCOUNTER — Other Ambulatory Visit (HOSPITAL_BASED_OUTPATIENT_CLINIC_OR_DEPARTMENT_OTHER): Payer: Self-pay | Admitting: Obstetrics & Gynecology

## 2023-11-19 DIAGNOSIS — R4589 Other symptoms and signs involving emotional state: Secondary | ICD-10-CM

## 2023-11-20 ENCOUNTER — Other Ambulatory Visit (HOSPITAL_BASED_OUTPATIENT_CLINIC_OR_DEPARTMENT_OTHER): Payer: Self-pay | Admitting: *Deleted

## 2023-11-20 NOTE — Progress Notes (Signed)
 Pt requests refill on fluoxetine  mg capsules. Her insurance will not pay for tablets and she has not been doing the mg dosage because of this. Refill sent.

## 2023-11-21 NOTE — Progress Notes (Signed)
 [facility] [address] [place] [address] Phone: [phone] Subjective:   Delwyn Filippo, am serving as a scribe for Dr. Ronnell Coins.  I'm seeing this patient by the request  of:  Skip Dull, MD  CC: Left knee pain  LKG:[id number]  Sharon Page is a 63 y.o. female coming in with complaint of L knee pain. Patient states that she take Celebrex prn and this has been helpful. Pain began 3 years go and it improved with PT. In February she stayed in a house with concrete floors and this caused pain to increase. Pain mostly over medial aspect that can radiate into the shin. Patient stretches as well for pain relief.   Xray 06/22/2020 L knee IMPRESSION: Lateral patellar subluxation. Small joint effusion. No fracture or dislocation. Osteoarthritic change, most marked in the patellofemoral joint region.     Past Medical History:  Diagnosis Date   Constipation    Environmental allergies    Frequency of urination    GERD (gastroesophageal reflux disease)    diet controlled -tx tums   Hematuria    Hiatal hernia    History of gestational hypertension    History of kidney stones    History of pulmonary embolism 01/21/2011   bilateral -- RML, RLL, LLL   OA (osteoarthritis)    Pain in Achilles tendon    right   Right ureteral stone    Urgency of urination    Uterine fibroid    Wears glasses    Past Surgical History:  Procedure Laterality Date   CESAREAN SECTION  1990 and 1995   COLONOSCOPY  11/12/2013   CYSTOSCOPY/URETEROSCOPY/HOLMIUM LASER/STENT PLACEMENT Right 03/21/2017   Procedure: CYSTOSCOPY/ RETROGRADE/URETEROSCOPY/STENT PLACEMENT;  Surgeon: Adelbert Homans, MD;  Location: [facility];  Service: Urology;  Laterality: Right;   CYSTOSCOPY/URETEROSCOPY/HOLMIUM LASER/STENT PLACEMENT Right 03/30/2017   Procedure: CYSTOSCOPY/URETEROSCOPY/HOLMIUM LASER/STENT EXCHANGE;  Surgeon: Adelbert Homans, MD;   Location: [facility];  Service: Urology;  Laterality: Right;   DILATATION & CURETTAGE/HYSTEROSCOPY WITH MYOSURE N/A 01/08/2017   Procedure: DILATATION & CURETTAGE/HYSTEROSCOPY WITH MYOSURE;  Surgeon: Lillian Rein, MD;  Location: [facility];  Service: Gynecology;  Laterality: N/A;   HOLMIUM LASER APPLICATION Right 03/30/2017   Procedure: HOLMIUM LASER APPLICATION;  Surgeon: Adelbert Homans, MD;  Location: [facility];  Service: Urology;  Laterality: Right;   LAPAROSCOPIC CHOLECYSTECTOMY  1995   STRABISMUS SURGERY Bilateral age 6   TONSILLECTOMY  child   TRANSTHORACIC ECHOCARDIOGRAM  02/07/2017   EF 55-60%   WISDOM TOOTH EXTRACTION     Social History   Socioeconomic History   Marital status: Married    Spouse name: John   Number of children: 3   Years of education: Not on file   Highest education level: Not on file  Occupational History   Not on file  Tobacco Use   Smoking status: Never   Smokeless tobacco: Never  Vaping Use   Vaping status: Never Used  Substance and Sexual Activity   Alcohol use: Yes    Alcohol/week: 5.0 standard drinks of alcohol    Types: 5 Glasses of wine per week   Drug use: No   Sexual activity: Yes    Partners: Male    Birth control/protection: Post-menopausal  Other Topics Concern   Not on file  Social History Narrative   Originally from [place]  -- Moved here 16 years ago.    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 Is now looking for a job.   Social Drivers of Corporate investment banker Strain: Low Risk  (10/09/2023)   Received from [facility]   Overall Financial Resource Strain (CARDIA)    Difficulty of Paying Living Expenses: Not hard at all  Food Insecurity: No Food Insecurity (10/09/2023)   Received [facility]   Hunger Vital Sign    Worried About Running Out of Food in the Last Year: Never true    Ran Out of Food in the Last Year: Never true  Transportation Needs: No Transportation  Needs (10/09/2023)   Received from [facility] - Transportation    Lack of Transportation (Medical): No    Lack of Transportation (Non-Medical): No  Physical Activity: Sufficiently Active (10/09/2023)   Received from [facility]   Exercise Vital Sign    Days of Exercise per Week: 5 days    Minutes of Exercise per Session: 30 min  Recent Concern: Physical Activity - Insufficiently Active (08/28/2023)   Received from [facility]   Exercise Vital Sign    Days of Exercise per Week: 5 days    Minutes of Exercise per Session: 20 min  Stress: No Stress Concern Present (10/09/2023)   Received from [facility] - Occupational Stress Questionnaire    Feeling of Stress : Only a little  Recent Concern: Stress - Stress Concern Present (08/28/2023)   Received from [facility] - Occupational Stress Questionnaire    Feeling of Stress : To some extent  Social Connections: Socially Integrated (10/09/2023)   Received from [facility]   Social Network    How would you rate your social network (family, work, friends)?: Good participation with social networks   Allergies  Allergen Reactions   Ciprofloxacin Hcl Other (See Comments)    Agitation    Codeine Other (See Comments)    agitation   Dilaudid [Hydromorphone Hcl] Other (See Comments)    Agitation    Morphine And Codeine Other (See Comments)    Agitation    Family History  Problem Relation Age of Onset   Lung cancer Mother    Tuberculosis Mother        as a child   Lymphoma Mother    Non-Hodgkin's lymphoma Mother    Diabetes Father    Hypertension Father    Deep vein thrombosis Father    Congestive Heart Failure Father    Healthy Daughter    Crohn's disease Daughter    Allergies Daughter    Colon cancer Paternal Aunt        x2   Skin cancer Maternal Grandmother    Heart attack Brother        half brother - 53   Breast cancer Neg  Hx    BRCA 1/2 Neg Hx          Current Outpatient Medications (Other):    Ascorbic Acid (VITAMIN C) 1000 MG tablet, Take 1,000 mg by mouth daily.   AZO-CRANBERRY PO, Take by mouth.   B Complex-C (SUPER B COMPLEX PO), Take 1 capsule by mouth daily.   Cholecalciferol (VITAMIN D3) 2000 units TABS, Take 2,000 Int'l Units by mouth daily.   D-Mannose 500 MG CAPS,    EVENING PRIMROSE OIL PO, Take 1,300 mg by mouth daily.    FLUoxetine  (PROZAC ) 10 MG capsule, TAKE 1 CAPSULE BY MOUTH EVERY DAY   Ginkgo Biloba 100 MG  CAPS, Take by mouth.   MAGNESIUM CITRATE PO, Take 400 mg by mouth at bedtime.   Multiple Vitamin (MULTIVITAMIN) capsule, Take by mouth.   Omega 3-6-9 Fatty Acids (OMEGA-3-6-9 PO), Take 1,600 mg by mouth daily.   Probiotic Product (PROBIOTIC PO), Take 1 capsule by mouth daily.   Selenium 200 MCG CAPS,    Reviewed prior external information including notes and imaging from  primary care provider As well as notes that were available from care everywhere and other healthcare systems.  Past medical history, social, surgical and family history all reviewed in electronic medical record.  No pertanent information unless stated regarding to the chief complaint.   Review of Systems:  No headache, visual changes, nausea, vomiting, diarrhea, constipation, dizziness, abdominal pain, skin rash, fevers, chills, night sweats, weight loss, swollen lymph nodes, body aches, joint swelling, chest pain, shortness of breath, mood changes. POSITIVE muscle aches  Objective  Last menstrual period 12/28/2010.   General: No apparent distress alert and oriented x3 mood and affect normal, dressed appropriately.  HEENT: Pupils equal, extraocular movements intact  Respiratory: Patient's speak in full sentences and does not appear short of breath  Cardiovascular: No lower extremity edema, non tender, no erythema  Left knee exam shows instability noted with valgus and varus force.  Does have some effusion  noted to the patellofemoral joint.  Does have some mild crepitus noted.  Limited muscular skeletal ultrasound was performed and interpreted by Ronnell Coins, M  Limited ultrasound does show some hypoechoic changes and severe narrowing of the patellofemoral joint space. Medial and lateral joint space of some mild narrowing but no significant abnormality of the meniscus noted. Impression: Severe patellofemoral arthritis  After informed written and verbal consent, patient was seated on exam table. Left knee was prepped with alcohol swab and utilizing anterolateral approach, patient's left knee space was injected with 4:1  marcaine 0.5%: Kenalog  40mg /dL. Patient tolerated the procedure well without immediate complications.   Impression and Recommendations:     The above documentation has been reviewed and is accurate and complete Marqueta Pulley M Shterna Laramee, DO

## 2023-11-22 ENCOUNTER — Other Ambulatory Visit: Payer: Self-pay

## 2023-11-22 ENCOUNTER — Encounter: Payer: Self-pay | Admitting: Family Medicine

## 2023-11-22 ENCOUNTER — Ambulatory Visit: Admitting: Family Medicine

## 2023-11-22 ENCOUNTER — Ambulatory Visit

## 2023-11-22 VITALS — BP 122/80 | HR 66 | Ht 60.0 in | Wt 235.0 lb

## 2023-11-22 DIAGNOSIS — M25562 Pain in left knee: Secondary | ICD-10-CM | POA: Diagnosis not present

## 2023-11-22 DIAGNOSIS — M1712 Unilateral primary osteoarthritis, left knee: Secondary | ICD-10-CM

## 2023-11-22 NOTE — Assessment & Plan Note (Signed)
 Patient given injection and tolerated the procedure well.  Do believe that there is severe osteoarthritic changes of the patellofemoral joint noted.  Discussed the importance of home exercises, VMO strengthening, patient does have an abnormal thigh to calf ratio and instability noted with valgus and varus force.  Patient is ambulatory and I do believe in a stability brace with a Tru pull lite aspect.  Will do think that this will be helpful.  Patient will continue to stay active otherwise.  Will work on getting BMI under 40.  Follow-up again in 6 to 8 weeks.  Could be a candidate for viscosupplementation.

## 2023-11-22 NOTE — Patient Instructions (Addendum)
 Xray today OA stability w tru pull lite VMO exercises Injection today Approval for gel Take Celebrex 2x a day for 5 days See me in 2 months

## 2023-11-23 ENCOUNTER — Telehealth: Payer: Self-pay

## 2023-11-23 ENCOUNTER — Encounter: Payer: Self-pay | Admitting: Family Medicine

## 2023-11-23 NOTE — Telephone Encounter (Signed)
 Patient is scheduled for a follow up on 7/1 with Dr Felipe Horton.  Okay to add to visit note?

## 2023-11-23 NOTE — Telephone Encounter (Signed)
 Patient needs an appointment when medication is stocked.   Durolane approved for left knee. Patient has a Self Funded Commercial POS Calendar year plan with an effective date of 07/25/2023. Plan follows [employer] guidelines. Specialist office visits, Durolane [phone] and Administration 20610/20611 are covered at 80% of allowable amount. Deductible has been met. If out of pocket is met, coverage goes to 100%. No pre-cert or referrals needed. Medical notes must be submitted with the claim. Include medical necessity, diagnosis, and all clinicals.  Case #: [id number] Exp: 05/25/2024

## 2023-11-26 NOTE — Telephone Encounter (Signed)
 Noted.

## 2024-01-21 NOTE — Progress Notes (Unsigned)
 [facility] [address] [place] [address] Phone: [phone] Subjective:   Sharon Page, am serving as a scribe for Dr. Arthea Claudene.  I'm seeing this patient by the request  of:  Niki Cousin, MD  CC: Left knee pain follow-up  YEP:Dlagzrupcz  11/22/2023 Patient given injection and tolerated the procedure well. Do believe that there is severe osteoarthritic changes of the patellofemoral joint noted. Discussed the importance of home exercises, VMO strengthening, patient does have an abnormal thigh to calf ratio and instability noted with valgus and varus force. Patient is ambulatory and I do believe in a stability brace with a Tru pull lite aspect. Will do think that this will be helpful. Patient will continue to stay active otherwise. Will work on getting BMI under 40. Follow-up again in 6 to 8 weeks. Could be a candidate for viscosupplementation.   Update 01/22/2024 Sharon Page is a 63 y.o. female coming in with complaint of L knee pain. Patient states that she is doing better after injection.       Past Medical History:  Diagnosis Date   Constipation    Environmental allergies    Frequency of urination    GERD (gastroesophageal reflux disease)    diet controlled -tx tums   Hematuria    Hiatal hernia    History of gestational hypertension    History of kidney stones    History of pulmonary embolism 01/21/2011   bilateral -- RML, RLL, LLL   OA (osteoarthritis)    Pain in Achilles tendon    right   Right ureteral stone    Urgency of urination    Uterine fibroid    Wears glasses    Past Surgical History:  Procedure Laterality Date   CESAREAN SECTION  1990 and 1995   COLONOSCOPY  11/12/2013   CYSTOSCOPY/URETEROSCOPY/HOLMIUM LASER/STENT PLACEMENT Right 03/21/2017   Procedure: CYSTOSCOPY/ RETROGRADE/URETEROSCOPY/STENT PLACEMENT;  Surgeon: Devere Lonni Righter, MD;  Location: [facility];  Service: Urology;   Laterality: Right;   CYSTOSCOPY/URETEROSCOPY/HOLMIUM LASER/STENT PLACEMENT Right 03/30/2017   Procedure: CYSTOSCOPY/URETEROSCOPY/HOLMIUM LASER/STENT EXCHANGE;  Surgeon: Devere Lonni Righter, MD;  Location: [facility];  Service: Urology;  Laterality: Right;   DILATATION & CURETTAGE/HYSTEROSCOPY WITH MYOSURE N/A 01/08/2017   Procedure: DILATATION & CURETTAGE/HYSTEROSCOPY WITH MYOSURE;  Surgeon: Cleotilde Ronal RAMAN, MD;  Location: [facility];  Service: Gynecology;  Laterality: N/A;   HOLMIUM LASER APPLICATION Right 03/30/2017   Procedure: HOLMIUM LASER APPLICATION;  Surgeon: Devere Lonni Righter, MD;  Location: [facility];  Service: Urology;  Laterality: Right;   LAPAROSCOPIC CHOLECYSTECTOMY  1995   STRABISMUS SURGERY Bilateral age 63   TONSILLECTOMY  child   TRANSTHORACIC ECHOCARDIOGRAM  02/07/2017   EF 55-60%   WISDOM TOOTH EXTRACTION     Social History   Socioeconomic History   Marital status: Married    Spouse name: John   Number of children: 3   Years of education: Not on file   Highest education level: Not on file  Occupational History   Not on file  Tobacco Use   Smoking status: Never   Smokeless tobacco: Never  Vaping Use   Vaping status: Never Used  Substance and Sexual Activity   Alcohol use: Yes    Alcohol/week: 5.0 standard drinks of alcohol    Types: 5 Glasses of wine per week   Drug use: No   Sexual activity: Yes    Partners: Male    Birth control/protection: Post-menopausal  Other Topics Concern   Not on file  Social History Narrative   Originally from [place]  -- Moved here 16 years ago.    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   Social Drivers of Corporate investment banker Strain: Low Risk  (10/09/2023)   Received from [facility]   Overall Financial Resource Strain (CARDIA)    Difficulty of Paying Living Expenses: Not hard at all  Food Insecurity: No Food Insecurity (10/09/2023)   Received  from [facility]   Hunger Vital Sign    Within the past 12 months, you worried that your food would run out before you got the money to buy more.: Never true    Within the past 12 months, the food you bought just didn't last and you didn't have money to get more.: Never true  Transportation Needs: No Transportation Needs (10/09/2023)   Received from [facility] - [facility]    Lack of Transportation (Medical): No    Lack of Transportation (Non-Medical): No  Physical Activity: Sufficiently Active (10/09/2023)   Received from [facility]   Exercise Vital Sign    On average, how many days per week do you engage in moderate to strenuous exercise (like a brisk walk)?: 5 days    On average, how many minutes do you engage in exercise at this level?: 30 min  Recent Concern: Physical Activity - Insufficiently Active (08/28/2023)   Received from [facility]   Exercise Vital Sign    Days of Exercise per Week: 5 days    Minutes of Exercise per Session: 20 min  Stress: No Stress Concern Present (10/09/2023)   Received from [facility] - Occupational Stress Questionnaire    Feeling of Stress : Only a little  Recent Concern: Stress - Stress Concern Present (08/28/2023)   Received from [facility] - Occupational Stress Questionnaire    Feeling of Stress : To some extent  Social Connections: Socially Integrated (10/09/2023)   Received from [facility]   Social Network    How would you rate your social network (family, work, friends)?: Good participation with social networks   Allergies  Allergen Reactions   Ciprofloxacin Hcl Other (See Comments)    Agitation    Codeine Other (See Comments)    agitation   Dilaudid [Hydromorphone Hcl] Other (See Comments)    Agitation    Morphine And Codeine Other (See Comments)    Agitation    Family History  Problem Relation Age of Onset   Lung  cancer Mother    Tuberculosis Mother        as a child   Lymphoma Mother    Non-Hodgkin's lymphoma Mother    Diabetes Father    Hypertension Father    Deep vein thrombosis Father    Congestive Heart Failure Father    Healthy Daughter    Crohn's disease Daughter    Allergies Daughter    Colon cancer Paternal Aunt        x2   Skin cancer Maternal Grandmother    Heart attack Brother        half brother - 60   Breast cancer Neg Hx    BRCA 1/2 Neg Hx          Current Outpatient Medications (Other):    Ascorbic Acid (VITAMIN C) 1000 MG tablet, Take 1,000 mg by mouth  daily.   AZO-CRANBERRY PO, Take by mouth.   B Complex-C (SUPER B COMPLEX PO), Take 1 capsule by mouth daily.   Cholecalciferol (VITAMIN D3) 2000 units TABS, Take 2,000 Int'l Units by mouth daily.   D-Mannose 500 MG CAPS,    EVENING PRIMROSE OIL PO, Take 1,300 mg by mouth daily.    FLUoxetine  (PROZAC ) 10 MG capsule, TAKE 1 CAPSULE BY MOUTH EVERY DAY   Ginkgo Biloba 100 MG CAPS, Take by mouth.   MAGNESIUM CITRATE PO, Take 400 mg by mouth at bedtime.   Multiple Vitamin (MULTIVITAMIN) capsule, Take by mouth.   Omega 3-6-9 Fatty Acids (OMEGA-3-6-9 PO), Take 1,600 mg by mouth daily.   Probiotic Product (PROBIOTIC PO), Take 1 capsule by mouth daily.   Selenium 200 MCG CAPS,    Reviewed prior external information including notes and imaging from  primary care provider As well as notes that were available from care everywhere and other healthcare systems.  Past medical history, social, surgical and family history all reviewed in electronic medical record.  No pertanent information unless stated regarding to the chief complaint.   Review of Systems:  No headache, visual changes, nausea, vomiting, diarrhea, constipation, dizziness, abdominal pain, skin rash, fevers, chills, night sweats, weight loss, swollen lymph nodes, body aches, joint swelling, chest pain, shortness of breath, mood changes. POSITIVE muscle  aches  Objective  Last menstrual period 12/28/2010.   General: No apparent distress alert and oriented x3 mood and affect normal, dressed appropriately.  HEENT: Pupils equal, extraocular movements intact  Respiratory: Patient's speak in full sentences and does not appear short of breath  Cardiovascular: No lower extremity edema, non tender, no erythema  Left knee exam shows arthritic changes noted.  Trace effusion noted of the patellofemoral joint.  Patient does have instability with valgus and varus force  After informed written and verbal consent, patient was seated on exam table. Left knee was prepped with alcohol swab and utilizing anterolateral approach, patient's left knee space was injected with 60 mg per 3 mL of Durolane (sodium hyaluronate) in a prefilled syringe was injected easily into the knee through a 22-gauge needle..Patient tolerated the procedure well without immediate complications.     Impression and Recommendations:    The above documentation has been reviewed and is accurate and complete Delainee Tramel M Cornelius Schuitema, DO

## 2024-01-22 ENCOUNTER — Encounter: Payer: Self-pay | Admitting: Family Medicine

## 2024-01-22 ENCOUNTER — Ambulatory Visit: Admitting: Family Medicine

## 2024-01-22 VITALS — BP 124/86 | HR 76 | Ht 60.0 in | Wt 238.0 lb

## 2024-01-22 DIAGNOSIS — M1712 Unilateral primary osteoarthritis, left knee: Secondary | ICD-10-CM | POA: Diagnosis not present

## 2024-01-22 MED ORDER — SODIUM HYALURONATE 60 MG/3ML IX PRSY
60.0000 mg | PREFILLED_SYRINGE | Freq: Once | INTRA_ARTICULAR | Status: AC
  Administered 2024-01-22: 60 mg via INTRA_ARTICULAR

## 2024-01-22 NOTE — Patient Instructions (Addendum)
 Enjoy your trip Durolane today for L knee See me again in 3 months

## 2024-01-22 NOTE — Assessment & Plan Note (Signed)
 Viscosupplementation given today.  Chronic problem with worsening symptoms.  Patient is working on weight loss.  Do need to get BMI under 40 if patient would be a surgical intervention but I am hoping that patient will do well with the conservative therapy.  Feels more stability with the bracing.  Viscosupplementation to decrease the recurrent inflammation.  Follow-up again in 6 to 8 weeks.

## 2024-01-27 ENCOUNTER — Encounter: Payer: Self-pay | Admitting: Family Medicine

## 2024-01-27 MED ORDER — CELECOXIB 200 MG PO CAPS: ORAL_CAPSULE | ORAL | 2 refills | Status: AC

## 2024-02-09 ENCOUNTER — Other Ambulatory Visit (HOSPITAL_BASED_OUTPATIENT_CLINIC_OR_DEPARTMENT_OTHER): Payer: Self-pay | Admitting: Obstetrics & Gynecology

## 2024-02-09 DIAGNOSIS — R4589 Other symptoms and signs involving emotional state: Secondary | ICD-10-CM

## 2024-02-11 NOTE — Telephone Encounter (Signed)
 LMOM at 9:47 for patient to call the office. According to last note patient is seeing Bambi Cottle. Did she request this med from us ?

## 2024-03-19 ENCOUNTER — Encounter: Payer: Self-pay | Admitting: Family Medicine

## 2024-04-01 ENCOUNTER — Other Ambulatory Visit (HOSPITAL_BASED_OUTPATIENT_CLINIC_OR_DEPARTMENT_OTHER): Payer: Self-pay

## 2024-04-01 ENCOUNTER — Encounter (HOSPITAL_BASED_OUTPATIENT_CLINIC_OR_DEPARTMENT_OTHER): Payer: Self-pay | Admitting: Obstetrics & Gynecology

## 2024-04-01 MED ORDER — FLUOXETINE HCL 20 MG PO CAPS: 20.0000 mg | ORAL_CAPSULE | Freq: Every day | ORAL | 1 refills | Status: AC

## 2024-04-21 ENCOUNTER — Encounter: Payer: Self-pay | Admitting: Family Medicine

## 2024-06-29 ENCOUNTER — Encounter (HOSPITAL_BASED_OUTPATIENT_CLINIC_OR_DEPARTMENT_OTHER): Payer: Self-pay | Admitting: Obstetrics & Gynecology

## 2024-07-21 ENCOUNTER — Other Ambulatory Visit: Payer: Self-pay | Admitting: Obstetrics & Gynecology

## 2024-07-21 DIAGNOSIS — Z1231 Encounter for screening mammogram for malignant neoplasm of breast: Secondary | ICD-10-CM

## 2024-08-20 ENCOUNTER — Other Ambulatory Visit: Payer: Self-pay | Admitting: Medical Genetics

## 2024-08-27 ENCOUNTER — Ambulatory Visit

## 2024-08-28 ENCOUNTER — Other Ambulatory Visit: Payer: Self-pay | Admitting: Medical Genetics

## 2024-08-28 DIAGNOSIS — Z006 Encounter for examination for normal comparison and control in clinical research program: Secondary | ICD-10-CM

## 2024-09-10 ENCOUNTER — Other Ambulatory Visit (HOSPITAL_COMMUNITY)

## 2024-09-22 ENCOUNTER — Ambulatory Visit

## 2024-10-09 ENCOUNTER — Ambulatory Visit (HOSPITAL_BASED_OUTPATIENT_CLINIC_OR_DEPARTMENT_OTHER): Admitting: Obstetrics & Gynecology
